# Patient Record
Sex: Female | Born: 1935 | Race: Asian | Hispanic: No | Marital: Single | Smoking: Never smoker
Health system: Southern US, Community
[De-identification: ages and names within clinical notes are randomized; demographics above are authoritative.]

## PROBLEM LIST (undated history)

## (undated) DIAGNOSIS — R011 Cardiac murmur, unspecified: Secondary | ICD-10-CM

## (undated) DIAGNOSIS — F0390 Unspecified dementia without behavioral disturbance: Secondary | ICD-10-CM

## (undated) HISTORY — DX: Cardiac murmur, unspecified: R01.1

---

## 2009-03-04 ENCOUNTER — Emergency Department (HOSPITAL_COMMUNITY): Admission: EM | Admit: 2009-03-04 | Discharge: 2009-03-04 | Payer: Self-pay | Admitting: Emergency Medicine

## 2010-04-17 LAB — CBC
HCT: 34.6 % — ABNORMAL LOW (ref 36.0–46.0)
Hemoglobin: 11.5 g/dL — ABNORMAL LOW (ref 12.0–15.0)
RBC: 4.25 MIL/uL (ref 3.87–5.11)
WBC: 7.5 10*3/uL (ref 4.0–10.5)

## 2010-04-17 LAB — DIFFERENTIAL
Basophils Absolute: 0 10*3/uL (ref 0.0–0.1)
Eosinophils Absolute: 0.3 10*3/uL (ref 0.0–0.7)
Lymphocytes Relative: 30 % (ref 12–46)
Lymphs Abs: 2.2 10*3/uL (ref 0.7–4.0)

## 2010-04-17 LAB — POCT CARDIAC MARKERS: Troponin i, poc: <0.05 ng/mL (ref 0.00–0.09)

## 2010-04-17 LAB — COMPREHENSIVE METABOLIC PANEL
ALT: 15 U/L (ref 0–35)
Albumin: 4 g/dL (ref 3.5–5.2)
Alkaline Phosphatase: 54 U/L (ref 39–117)
CO2: 27 mEq/L (ref 19–32)
Calcium: 9.2 mg/dL (ref 8.4–10.5)
Chloride: 103 mEq/L (ref 96–112)
Creatinine, Ser: 0.9 mg/dL (ref 0.4–1.2)
Total Bilirubin: 1.2 mg/dL (ref 0.3–1.2)
Total Protein: 7.2 g/dL (ref 6.0–8.3)

## 2010-04-17 LAB — URINALYSIS, ROUTINE W REFLEX MICROSCOPIC
Bilirubin Urine: NEGATIVE
Glucose, UA: NEGATIVE mg/dL
Ketones, ur: NEGATIVE mg/dL
pH: 6 (ref 5.0–8.0)

## 2010-04-17 LAB — URINE MICROSCOPIC-ADD ON

## 2013-01-12 ENCOUNTER — Encounter: Payer: Self-pay | Admitting: *Deleted

## 2013-01-12 ENCOUNTER — Institutional Professional Consult (permissible substitution): Payer: Self-pay | Admitting: Cardiology

## 2013-01-20 ENCOUNTER — Encounter: Payer: Self-pay | Admitting: Cardiology

## 2014-02-02 ENCOUNTER — Telehealth: Payer: Self-pay | Admitting: Internal Medicine

## 2014-02-02 NOTE — Telephone Encounter (Signed)
[facility] (Dr Lerry Linerwight Williams) for appointment with Dr Rennis GoldenHilty on 03/02/14.  [facility]. lp

## 2014-03-02 ENCOUNTER — Ambulatory Visit: Payer: Self-pay | Admitting: Internal Medicine

## 2016-01-22 ENCOUNTER — Observation Stay (HOSPITAL_COMMUNITY)
Admission: EM | Admit: 2016-01-22 | Discharge: 2016-01-23 | Disposition: A | Discharge status: Home Health | Payer: Medicare Other | Attending: Internal Medicine | Admitting: Internal Medicine

## 2016-01-22 ENCOUNTER — Encounter (HOSPITAL_COMMUNITY): Payer: Self-pay | Admitting: *Deleted

## 2016-01-22 ENCOUNTER — Emergency Department (HOSPITAL_COMMUNITY): Payer: Medicare Other

## 2016-01-22 DIAGNOSIS — E876 Hypokalemia: Secondary | ICD-10-CM | POA: Diagnosis present

## 2016-01-22 DIAGNOSIS — Y92009 Unspecified place in unspecified non-institutional (private) residence as the place of occurrence of the external cause: Secondary | ICD-10-CM

## 2016-01-22 DIAGNOSIS — W109XXA Fall (on) (from) unspecified stairs and steps, initial encounter: Secondary | ICD-10-CM | POA: Insufficient documentation

## 2016-01-22 DIAGNOSIS — R296 Repeated falls: Secondary | ICD-10-CM

## 2016-01-22 DIAGNOSIS — F0390 Unspecified dementia without behavioral disturbance: Secondary | ICD-10-CM | POA: Diagnosis not present

## 2016-01-22 DIAGNOSIS — L03116 Cellulitis of left lower limb: Secondary | ICD-10-CM

## 2016-01-22 DIAGNOSIS — R531 Weakness: Secondary | ICD-10-CM

## 2016-01-22 DIAGNOSIS — T68XXXA Hypothermia, initial encounter: Secondary | ICD-10-CM | POA: Diagnosis not present

## 2016-01-22 DIAGNOSIS — R001 Bradycardia, unspecified: Secondary | ICD-10-CM | POA: Diagnosis present

## 2016-01-22 DIAGNOSIS — S0083XA Contusion of other part of head, initial encounter: Secondary | ICD-10-CM | POA: Diagnosis not present

## 2016-01-22 DIAGNOSIS — Z66 Do not resuscitate: Secondary | ICD-10-CM | POA: Diagnosis not present

## 2016-01-22 DIAGNOSIS — W19XXXA Unspecified fall, initial encounter: Secondary | ICD-10-CM

## 2016-01-22 DIAGNOSIS — W108XXA Fall (on) (from) other stairs and steps, initial encounter: Secondary | ICD-10-CM | POA: Diagnosis present

## 2016-01-22 DIAGNOSIS — D649 Anemia, unspecified: Secondary | ICD-10-CM | POA: Diagnosis not present

## 2016-01-22 HISTORY — DX: Unspecified dementia, unspecified severity, without behavioral disturbance, psychotic disturbance, mood disturbance, and anxiety: F03.90

## 2016-01-22 LAB — BASIC METABOLIC PANEL
Anion gap: 10 (ref 5–15)
BUN: 46 mg/dL — AB (ref 6–20)
CHLORIDE: 109 mmol/L (ref 101–111)
CO2: 26 mmol/L (ref 22–32)
CREATININE: 1.01 mg/dL — AB (ref 0.44–1.00)
Calcium: 8.6 mg/dL — ABNORMAL LOW (ref 8.9–10.3)
GFR calc Af Amer: 57 mL/min — ABNORMAL LOW (ref >60)
GFR calc non Af Amer: 49 mL/min — ABNORMAL LOW (ref >60)
GLUCOSE: 89 mg/dL (ref 65–99)
Potassium: 3 mmol/L — ABNORMAL LOW (ref 3.5–5.1)
SODIUM: 145 mmol/L (ref 135–145)

## 2016-01-22 LAB — URINALYSIS, ROUTINE W REFLEX MICROSCOPIC
BILIRUBIN URINE: NEGATIVE
GLUCOSE, UA: NEGATIVE mg/dL
HGB URINE DIPSTICK: NEGATIVE
Ketones, ur: 5 mg/dL — AB
Leukocytes, UA: NEGATIVE
Nitrite: NEGATIVE
PH: 5 (ref 5.0–8.0)
Protein, ur: NEGATIVE mg/dL
SPECIFIC GRAVITY, URINE: 1.017 (ref 1.005–1.030)

## 2016-01-22 LAB — CBC
HCT: 31.4 % — ABNORMAL LOW (ref 36.0–46.0)
HEMOGLOBIN: 10.1 g/dL — AB (ref 12.0–15.0)
MCH: 24.9 pg — AB (ref 26.0–34.0)
MCHC: 32.2 g/dL (ref 30.0–36.0)
MCV: 77.3 fL — AB (ref 78.0–100.0)
Platelets: 193 10*3/uL (ref 150–400)
RBC: 4.06 MIL/uL (ref 3.87–5.11)
RDW: 15.1 % (ref 11.5–15.5)
WBC: 4.1 10*3/uL (ref 4.0–10.5)

## 2016-01-22 LAB — I-STAT CG4 LACTIC ACID, ED: LACTIC ACID, VENOUS: 0.73 mmol/L (ref 0.5–1.9)

## 2016-01-22 MED ORDER — CEFAZOLIN IN D5W 1 GM/50ML IV SOLN
1.0000 g | Freq: Once | INTRAVENOUS | Status: AC
  Administered 2016-01-23: 1 g via INTRAVENOUS
  Filled 2016-01-22: qty 50

## 2016-01-22 MED ORDER — POTASSIUM CHLORIDE CRYS ER 20 MEQ PO TBCR
20.0000 meq | EXTENDED_RELEASE_TABLET | Freq: Once | ORAL | Status: DC
Start: 2016-01-22 — End: 2016-01-23
  Filled 2016-01-22: qty 1

## 2016-01-22 MED ORDER — CEPHALEXIN 500 MG PO CAPS: 500.0000 mg | ORAL_CAPSULE | Freq: Once | ORAL | Status: DC

## 2016-01-22 MED ORDER — CEPHALEXIN 500 MG PO CAPS
500.0000 mg | ORAL_CAPSULE | Freq: Three times a day (TID) | ORAL | 0 refills | Status: DC
Start: 2016-01-22 — End: 2016-01-23

## 2016-01-22 NOTE — ED Provider Notes (Addendum)
[facility]-EMERGENCY DEPT Provider Note   CSN: [id number] Arrival date & time: 01/22/16  1829     History   Chief Complaint Chief Complaint  Patient presents with  . Fall    HPI Beth Hampton is a 82 y.o. female.  Patient brought from home by EMS s/p fall.  Unclear from report if mechanical fall vs syncope.  Patient has dementia at baseline, and family indicates pts mental status is c/w her baseline.   Family also indicates in past several days has been scratching at skin wound to left lower leg.  Patient not verbally responsive to questions - level 5 caveat. No reported fevers.  Family indicates at baseline confused, and spends most of day bent over at waist in sitting position, with head resting on legs.  No reported fevers. Family indicates pt appears generally weak.    The history is provided by the patient and a relative. The history is limited by the condition of the patient.  Fall     Past Medical History:  Diagnosis Date  . Murmur     There are no active problems to display for this patient.   History reviewed. No pertinent surgical history.  OB History    No data available       Home Medications    Prior to Admission medications   Not on File    Family History No family history on file.  Social History Social History  Substance Use Topics  . Smoking status: Never Smoker  . Smokeless tobacco: Never Used  . Alcohol use No     Allergies   Patient has no known allergies.   Review of Systems Review of Systems  Unable to perform ROS: Patient unresponsive  Patient not verbally responsive, level 5 caveat.    Physical Exam Updated Vital Signs BP 134/73   Pulse (!) 55   Resp 18   Ht 4\' 11"  (1.499 m)   Wt 40.4 kg   SpO2 100%   BMI 17.98 kg/m   Physical Exam  Constitutional: She appears well-developed and well-nourished. No distress.  HENT:  Mouth/Throat: Oropharynx is clear and moist.  Contusion to forehead/face.   Eyes:  Conjunctivae are normal. Pupils are equal, round, and reactive to light. No scleral icterus.  Neck: Neck supple. No tracheal deviation present.  Cardiovascular: Normal rate, regular rhythm, normal heart sounds and intact distal pulses.   Pulmonary/Chest: Effort normal and breath sounds normal. No respiratory distress.  Abdominal: Normal appearance. She exhibits no distension. There is no tenderness.  Genitourinary:  Genitourinary Comments: No cva tenderness  Musculoskeletal: She exhibits no edema.  CTLS spine, non tender, aligned, no step off. Approximately 8 cm diameter ulcerated area medial aspect left lower leg with mild surrounding erythema, ?mild/early cellulitis. No focal pain or bony tenderness on bil extremity exam.   Neurological:  pts opens eyes to command, and verbalizes to family in a way c/w her baseline/confused. Moves bil extremities purposefully.   Skin: Skin is warm and dry. No rash noted. She is not diaphoretic.  Psychiatric:  Mental state c/w baseline per family.   Nursing note and vitals reviewed.    ED Treatments / Results  Labs (all labs ordered are listed, but only abnormal results are displayed) Results for orders placed or performed during the hospital encounter of 01/22/16  CBC  Result Value Ref Range   WBC 4.1 4.0 - 10.5 K/uL   RBC 4.06 3.87 - 5.11 MIL/uL   Hemoglobin 10.1 (L) 12.0 - 15.0  g/dL   HCT 16.1 (L) 09.6 - 04.5 %   MCV 77.3 (L) 78.0 - 100.0 fL   MCH 24.9 (L) 26.0 - 34.0 pg   MCHC 32.2 30.0 - 36.0 g/dL   RDW 40.9 81.1 - 91.4 %   Platelets 193 150 - 400 K/uL  Basic metabolic panel  Result Value Ref Range   Sodium 145 135 - 145 mmol/L   Potassium 3.0 (L) 3.5 - 5.1 mmol/L   Chloride 109 101 - 111 mmol/L   CO2 26 22 - 32 mmol/L   Glucose, Bld 89 65 - 99 mg/dL   BUN 46 (H) 6 - 20 mg/dL   Creatinine, Ser 7.82 (H) 0.44 - 1.00 mg/dL   Calcium 8.6 (L) 8.9 - 10.3 mg/dL   GFR calc non Af Amer 49 (L) >60 mL/min   GFR calc Af Amer 57 (L) >60 mL/min     Anion gap 10 5 - 15   Ct Head Wo Contrast  Result Date: 01/22/2016 CLINICAL DATA:  Under witnessed fall earlier today. Failure to parotid with decreased mental status. Right periorbital bruising. EXAM: CT HEAD WITHOUT CONTRAST CT CERVICAL SPINE WITHOUT CONTRAST TECHNIQUE: Multidetector CT imaging of the head and cervical spine was performed following the standard protocol without intravenous contrast. Multiplanar CT image reconstructions of the cervical spine were also generated. COMPARISON:  CT head 03/04/2009. FINDINGS: CT HEAD FINDINGS Brain: There is no evidence of acute intracranial hemorrhage, mass lesion, brain edema or extra-axial fluid collection. There is progressive prominence of the ventricles and subarachnoid spaces consistent with atrophy. There are mild chronic small vessel ischemic changes in the periventricular white matter. There is no CT evidence of acute cortical infarction. Vascular: Intracranial vascular calcifications are present. No evidence of hyperdense vessel. Skull: Negative for fracture or focal lesion. Sinuses/Orbits: There is some mucosal thickening in the right maxillary sinus. The visualized paranasal sinuses, mastoid air cells and middle ears are otherwise clear. No air-fluid levels are identified. There is some periorbital soft tissue swelling on the right. The globes are intact. Significant maxillary and mandibular periodontal disease is partially imaged on the reformatted images. Other: None. CT CERVICAL SPINE FINDINGS Alignment: Reversal of lordosis without significant focal angulation or listhesis. Thoracic kyphosis. Skull base and vertebrae: The bones are demineralized. No evidence of acute cervical spine fracture or traumatic subluxation. Degenerative changes are present anteriorly at C1-2. Soft tissues and spinal canal: No prevertebral soft tissue swelling identified. No visible canal hematoma. The soft tissues of the neck appear diffusely edematous. The thyroid  gland is heterogeneously enlarged bilaterally, likely due to goiter. Disc levels: The disc spaces are relatively preserved. No large disc herniation identified. Upper chest: The lung apices are clear. There is atherosclerosis and tortuosity of the aorta and great vessels. As above, the thyroid gland is heterogeneously enlarged. Other: Exam mildly limited by thoracic kyphosis. IMPRESSION: 1. No acute intracranial or calvarial findings identified. 2. No evidence of acute cervical spine fracture, traumatic subluxation or static signs of instability. 3. Periodontal disease. 4. Right periorbital soft tissue swelling. 5. Thyromegaly, likely goiter. Electronically Signed   By: Carey Bullocks M.D.   On: 01/22/2016 20:36   Ct Cervical Spine Wo Contrast  Result Date: 01/22/2016 CLINICAL DATA:  Under witnessed fall earlier today. Failure to parotid with decreased mental status. Right periorbital bruising. EXAM: CT HEAD WITHOUT CONTRAST CT CERVICAL SPINE WITHOUT CONTRAST TECHNIQUE: Multidetector CT imaging of the head and cervical spine was performed following the standard protocol without intravenous contrast. Multiplanar  CT image reconstructions of the cervical spine were also generated. COMPARISON:  CT head 03/04/2009. FINDINGS: CT HEAD FINDINGS Brain: There is no evidence of acute intracranial hemorrhage, mass lesion, brain edema or extra-axial fluid collection. There is progressive prominence of the ventricles and subarachnoid spaces consistent with atrophy. There are mild chronic small vessel ischemic changes in the periventricular white matter. There is no CT evidence of acute cortical infarction. Vascular: Intracranial vascular calcifications are present. No evidence of hyperdense vessel. Skull: Negative for fracture or focal lesion. Sinuses/Orbits: There is some mucosal thickening in the right maxillary sinus. The visualized paranasal sinuses, mastoid air cells and middle ears are otherwise clear. No air-fluid  levels are identified. There is some periorbital soft tissue swelling on the right. The globes are intact. Significant maxillary and mandibular periodontal disease is partially imaged on the reformatted images. Other: None. CT CERVICAL SPINE FINDINGS Alignment: Reversal of lordosis without significant focal angulation or listhesis. Thoracic kyphosis. Skull base and vertebrae: The bones are demineralized. No evidence of acute cervical spine fracture or traumatic subluxation. Degenerative changes are present anteriorly at C1-2. Soft tissues and spinal canal: No prevertebral soft tissue swelling identified. No visible canal hematoma. The soft tissues of the neck appear diffusely edematous. The thyroid gland is heterogeneously enlarged bilaterally, likely due to goiter. Disc levels: The disc spaces are relatively preserved. No large disc herniation identified. Upper chest: The lung apices are clear. There is atherosclerosis and tortuosity of the aorta and great vessels. As above, the thyroid gland is heterogeneously enlarged. Other: Exam mildly limited by thoracic kyphosis. IMPRESSION: 1. No acute intracranial or calvarial findings identified. 2. No evidence of acute cervical spine fracture, traumatic subluxation or static signs of instability. 3. Periodontal disease. 4. Right periorbital soft tissue swelling. 5. Thyromegaly, likely goiter. Electronically Signed   By: Carey BullocksWilliam  Veazey M.D.   On: 01/22/2016 20:36    EKG  EKG Interpretation None       Radiology No results found.  Procedures Procedures (including critical care time)  Medications Ordered in ED Medications - No data to display   Initial Impression / Assessment and Plan / ED Course  I have reviewed the triage vital signs and the nursing notes.  Pertinent labs & imaging results that were available during my care of the patient were reviewed by me and considered in my medical decision making (see chart for details).  Clinical Course     Labs. Imaging studies.  Reviewed nursing notes and prior charts for additional history.   Recheck pt, mental status remains c/w baseline.  Pt in no apparent distress.  Icepack.   On checking temp, 90 rectally. bair Journalist, newspaperhugger.  Ivf.   Added lactate and cultures to workup. ua pending.  Will rx leg wound/?cellulitis w abx.   Given hypothermia, fall ?syncope vs mechanical, leg wound/cellulitis, will admit.   Hospitalist consulted for admission.       Final Clinical Impressions(s) / ED Diagnoses   Final diagnoses:  None    New Prescriptions New Prescriptions   No medications on file          Cathren LaineKevin Augustino Savastano, MD 01/22/16 2343

## 2016-01-22 NOTE — H&P (Addendum)
History and Physical    Beth Hampton YNW:[id number] DOB: Nov 08, 1926 DOA: 01/22/2016  PCP: No primary care provider on file.; [facility] [address]) Consultants:  None Patient coming from: home - with niece, her husband; has nighttime sitters; NOK: niece, Junious DresserConnie, [phone]  Chief Complaint: fall  HPI: Beth Hampton is a 82 y.o. female with medical history significant of dementia and a heart murmur presenting after a fall at home this AM.  Niece was home with her, reports that she tripped on the last step and fell down.  Down maybe 10 minutes, needed help getting up.  Very tired, maybe didn't sleep well last night.  She also scratches herself up very badly.  Unable to care for herself, doesn't listen to her niece - niece is interested in talking to SW about placement.   Denies acute illness other than today's fall.  The patient has advanced dementia and did not provide any of the history.   ED Course: Per Dr. Denton LankSteinl: Labs. Imaging studies.  Reviewed nursing notes and prior charts for additional history.  Recheck pt, mental status remains c/w baseline. Pt in no apparent distress.  Icepack.  On checking temp, 90 rectally. bair Journalist, newspaperhugger. Ivf.  Added lactate and cultures to workup. ua pending.  Will rx leg wound/?cellulitis w abx.  Given hypothermia, fall ?syncope vs mechanical, leg wound/cellulitis, will admit.  Hospitalist consulted for admission.    Review of Systems: As per HPI; otherwise 10 point review of systems reviewed and negative.   Ambulatory Status:  Ambulates independently  Past Medical History:  Diagnosis Date  . Dementia   . Murmur     History reviewed. No pertinent surgical history.  Social History   Social History  . Marital status: Single    Spouse name: N/A  . Number of children: N/A  . Years of education: N/A   Occupational History  . retired    Social History Main Topics  . Smoking status: Never Smoker  .  Smokeless tobacco: Current User    Types: Chew  . Alcohol use No  . Drug use: No  . Sexual activity: Not on file   Other Topics Concern  . Not on file   Social History Narrative  . No narrative on file    No Known Allergies  History reviewed. No pertinent family history.  Prior to Admission medications   Medication Sig Start Date End Date Taking? Authorizing Provider  cephALEXin (KEFLEX) 500 MG capsule Take 1 capsule (500 mg total) by mouth 3 (three) times daily. 01/22/16   Cathren LaineKevin Steinl, MD    Physical Exam: Vitals:   01/23/16 0030 01/23/16 0100 01/23/16 0103 01/23/16 0130  BP: (!) 99/54 99/55 99/55  (!) 95/47  Pulse: (!) 54 (!) 58 60 (!) 56  Resp: 21 13 12 23   Temp:   (!) 95.1 F (35.1 C)   TempSrc:   Rectal   SpO2: 97% 98% 97% 95%  Weight:      Height:         General:  Appears calm and comfortable and is sleeping in NAD under the bair hugger Eyes:  PERRL, EOMI, normal lids, iris ENT:  grossly normal hearing, lips & tongue, mmm Neck:  no LAD, masses or thyromegaly Cardiovascular:  Bradycardia, 3-4/6 systolic murmur, no r/g. No LE edema.  Respiratory:  CTA bilaterally, no w/r/r. Normal respiratory effort. Abdomen:  soft, ntnd, NABS Skin: 4 cm skin tear on the left anterior lower leg with small  amount of purulence; both legs are erythematous under the Bair hugger, but not obviously more affected than the remainder of that leg or the other leg.  Ecchymosis surrounding right eye > left Musculoskeletal:  grossly normal tone BUE/BLE, good ROM, no bony abnormality Psychiatric: somnolent, not interactive Neurologic: unable to perform  Labs on Admission: I have personally reviewed following labs and imaging studies  CBC:  Recent Labs Lab 01/22/16 2104  WBC 4.1  HGB 10.1*  HCT 31.4*  MCV 77.3*  PLT 193   Basic Metabolic Panel:  Recent Labs Lab 01/22/16 2104  NA 145  K 3.0*  CL 109  CO2 26  GLUCOSE 89  BUN 46*  CREATININE 1.01*  CALCIUM 8.6*    GFR: Estimated Creatinine Clearance: 25.5 mL/min (by C-G formula based on SCr of 1.01 mg/dL (H)). Liver Function Tests: No results for input(s): AST, ALT, ALKPHOS, BILITOT, PROT, ALBUMIN in the last 168 hours. No results for input(s): LIPASE, AMYLASE in the last 168 hours. No results for input(s): AMMONIA in the last 168 hours. Coagulation Profile: No results for input(s): INR, PROTIME in the last 168 hours. Cardiac Enzymes: No results for input(s): CKTOTAL, CKMB, CKMBINDEX, TROPONINI in the last 168 hours. BNP (last 3 results) No results for input(s): PROBNP in the last 8760 hours. HbA1C: No results for input(s): HGBA1C in the last 72 hours. CBG: No results for input(s): GLUCAP in the last 168 hours. Lipid Profile: No results for input(s): CHOL, HDL, LDLCALC, TRIG, CHOLHDL, LDLDIRECT in the last 72 hours. Thyroid Function Tests: No results for input(s): TSH, T4TOTAL, FREET4, T3FREE, THYROIDAB in the last 72 hours. Anemia Panel: No results for input(s): VITAMINB12, FOLATE, FERRITIN, TIBC, IRON, RETICCTPCT in the last 72 hours. Urine analysis:    Component Value Date/Time   COLORURINE YELLOW 01/22/2016 2231   APPEARANCEUR HAZY (A) 01/22/2016 2231   LABSPEC 1.017 01/22/2016 2231   PHURINE 5.0 01/22/2016 2231   GLUCOSEU NEGATIVE 01/22/2016 2231   HGBUR NEGATIVE 01/22/2016 2231   BILIRUBINUR NEGATIVE 01/22/2016 2231   KETONESUR 5 (A) 01/22/2016 2231   PROTEINUR NEGATIVE 01/22/2016 2231   UROBILINOGEN 1.0 03/04/2009 1536   NITRITE NEGATIVE 01/22/2016 2231   LEUKOCYTESUR NEGATIVE 01/22/2016 2231    Creatinine Clearance: Estimated Creatinine Clearance: 25.5 mL/min (by C-G formula based on SCr of 1.01 mg/dL (H)).  Sepsis Labs: @LABRCNTIP (procalcitonin:4,lacticidven:4) )No results found for this or any previous visit (from the past 240 hour(s)).   Radiological Exams on Admission: Ct Head Wo Contrast  Result Date: 01/22/2016 CLINICAL DATA:  Under witnessed fall earlier  today. Failure to parotid with decreased mental status. Right periorbital bruising. EXAM: CT HEAD WITHOUT CONTRAST CT CERVICAL SPINE WITHOUT CONTRAST TECHNIQUE: Multidetector CT imaging of the head and cervical spine was performed following the standard protocol without intravenous contrast. Multiplanar CT image reconstructions of the cervical spine were also generated. COMPARISON:  CT head 03/04/2009. FINDINGS: CT HEAD FINDINGS Brain: There is no evidence of acute intracranial hemorrhage, mass lesion, brain edema or extra-axial fluid collection. There is progressive prominence of the ventricles and subarachnoid spaces consistent with atrophy. There are mild chronic small vessel ischemic changes in the periventricular white matter. There is no CT evidence of acute cortical infarction. Vascular: Intracranial vascular calcifications are present. No evidence of hyperdense vessel. Skull: Negative for fracture or focal lesion. Sinuses/Orbits: There is some mucosal thickening in the right maxillary sinus. The visualized paranasal sinuses, mastoid air cells and middle ears are otherwise clear. No air-fluid levels are identified. There is some  periorbital soft tissue swelling on the right. The globes are intact. Significant maxillary and mandibular periodontal disease is partially imaged on the reformatted images. Other: None. CT CERVICAL SPINE FINDINGS Alignment: Reversal of lordosis without significant focal angulation or listhesis. Thoracic kyphosis. Skull base and vertebrae: The bones are demineralized. No evidence of acute cervical spine fracture or traumatic subluxation. Degenerative changes are present anteriorly at C1-2. Soft tissues and spinal canal: No prevertebral soft tissue swelling identified. No visible canal hematoma. The soft tissues of the neck appear diffusely edematous. The thyroid gland is heterogeneously enlarged bilaterally, likely due to goiter. Disc levels: The disc spaces are relatively preserved.  No large disc herniation identified. Upper chest: The lung apices are clear. There is atherosclerosis and tortuosity of the aorta and great vessels. As above, the thyroid gland is heterogeneously enlarged. Other: Exam mildly limited by thoracic kyphosis. IMPRESSION: 1. No acute intracranial or calvarial findings identified. 2. No evidence of acute cervical spine fracture, traumatic subluxation or static signs of instability. 3. Periodontal disease. 4. Right periorbital soft tissue swelling. 5. Thyromegaly, likely goiter. Electronically Signed   By: Carey Bullocks M.D.   On: 01/22/2016 20:36   Ct Cervical Spine Wo Contrast  Result Date: 01/22/2016 CLINICAL DATA:  Under witnessed fall earlier today. Failure to parotid with decreased mental status. Right periorbital bruising. EXAM: CT HEAD WITHOUT CONTRAST CT CERVICAL SPINE WITHOUT CONTRAST TECHNIQUE: Multidetector CT imaging of the head and cervical spine was performed following the standard protocol without intravenous contrast. Multiplanar CT image reconstructions of the cervical spine were also generated. COMPARISON:  CT head 03/04/2009. FINDINGS: CT HEAD FINDINGS Brain: There is no evidence of acute intracranial hemorrhage, mass lesion, brain edema or extra-axial fluid collection. There is progressive prominence of the ventricles and subarachnoid spaces consistent with atrophy. There are mild chronic small vessel ischemic changes in the periventricular white matter. There is no CT evidence of acute cortical infarction. Vascular: Intracranial vascular calcifications are present. No evidence of hyperdense vessel. Skull: Negative for fracture or focal lesion. Sinuses/Orbits: There is some mucosal thickening in the right maxillary sinus. The visualized paranasal sinuses, mastoid air cells and middle ears are otherwise clear. No air-fluid levels are identified. There is some periorbital soft tissue swelling on the right. The globes are intact. Significant  maxillary and mandibular periodontal disease is partially imaged on the reformatted images. Other: None. CT CERVICAL SPINE FINDINGS Alignment: Reversal of lordosis without significant focal angulation or listhesis. Thoracic kyphosis. Skull base and vertebrae: The bones are demineralized. No evidence of acute cervical spine fracture or traumatic subluxation. Degenerative changes are present anteriorly at C1-2. Soft tissues and spinal canal: No prevertebral soft tissue swelling identified. No visible canal hematoma. The soft tissues of the neck appear diffusely edematous. The thyroid gland is heterogeneously enlarged bilaterally, likely due to goiter. Disc levels: The disc spaces are relatively preserved. No large disc herniation identified. Upper chest: The lung apices are clear. There is atherosclerosis and tortuosity of the aorta and great vessels. As above, the thyroid gland is heterogeneously enlarged. Other: Exam mildly limited by thoracic kyphosis. IMPRESSION: 1. No acute intracranial or calvarial findings identified. 2. No evidence of acute cervical spine fracture, traumatic subluxation or static signs of instability. 3. Periodontal disease. 4. Right periorbital soft tissue swelling. 5. Thyromegaly, likely goiter. Electronically Signed   By: Carey Bullocks M.D.   On: 01/22/2016 20:36   Dg Chest Port 1 View  Result Date: 01/23/2016 CLINICAL DATA:  Status post fall. Generalized weakness and  decreased temperature. Initial encounter. EXAM: PORTABLE CHEST 1 VIEW COMPARISON:  Chest radiograph performed 03/04/2009 FINDINGS: The lungs are well-aerated. There is elevation of the right hemidiaphragm. There is no evidence of focal opacification, pleural effusion or pneumothorax. The right lung apex is obscured by the patient's head. The cardiomediastinal silhouette is borderline enlarged. Calcification is seen at the aortic arch. No acute osseous abnormalities are seen. IMPRESSION: Borderline cardiomegaly.  Elevation of the right hemidiaphragm. Lungs remain grossly clear. No displaced rib fracture seen. Electronically Signed   By: Roanna Raider M.D.   On: 01/23/2016 00:16    EKG: Independently reviewed.  Sinus bradycardia with rate 48; incomplete RBBB with no evidence of acute ischemia  Assessment/Plan Principal Problem:   Hypothermia Active Problems:   Fall (on) (from) other stairs and steps, initial encounter   Bradycardia   Anemia   Dementia   Hypokalemia   Hypothermia -Patient with hypothermia from unclear etiology -Does not have other SIRS criteria for sepsis, but patient received antibiotics in the ER and blood cultures are pending -Will not continue antibiotics at this time, as the patient has no apparent s/sx of infection, normal WBC count, and no other infectious sources realized on studies to date -May be related to bradycardia, environment, or other -Will observe overnight with Bair hugger, rectal probe until temp is consistently >95  Bradycardia -May be new onset of sick sinus syndrome -Patient does not regularly see a physician and niece reports that patient prefers a natural death -If concern for persistent bradycardia once she is no longer hypothermic, consider EP evaluation - but would definitely discuss with niece first because she may not wish to pursue this  Fall -Multiple abrasions/excoriations - which may also be from chronic scratching -Appears to have been a mechanical fall based on niece's description -No current concern for significant injury at this time -Leg wound may have superifcial infection - given Ancef x 1 in ER, will not continue for now, but will continue wound care order for bacitracin and sterile dressing  Dementia -Niece would like to discuss placement with SW  Hypokalemia -K 3.0 -Given 20 mEq KCl PO in ER, will give an additional 30 mEq for total of 50 mEq  Anemia -Hgb 10.1, which is lower than 11.5 in 2011 -Will follow  DVT  prophylaxis: Lovenox  Code Status: DNR - confirmed with family Family Communication: Niece present throughout evaluation Disposition Plan:  Home once clinically improved Consults called: SW  Admission status: It is my clinical opinion that referral for OBSERVATION is reasonable and necessary in this patient based on the above information provided. The aforementioned taken together are felt to place the patient at high risk for further clinical deterioration. However it is anticipated that the patient may be medically stable for discharge from the hospital within 24 to 48 hours.    Jonah Blue MD [facility]  If 7PM-7AM, please contact night-coverage [email or web address] Password [email or web address]  01/23/2016, 1:42 AM

## 2016-01-22 NOTE — Discharge Instructions (Signed)
It was our pleasure to provide your ER care today - we hope that you feel better.  Your ct scans were read as showing no acute bony injury or fracture.   You may give tylenol as need for pain.   Ice/coldpack to sore area.   Fall precautions - assist patient when ambulating.   For left leg ulcer, keep wound very clean - wash with soap and water 2x/day. Take antibiotic (keflex) as prescribed.  From today's labs, your potassium level is mildly low (3) - eat plenty of fruits and vegetables, and follow up with primary care doctor in 1 week.  Follow up closely with primary care doctor.   Return to ER if worse, new symptoms, fevers, change in mental status, severe pain, spreading redness from around leg wound, other concern.

## 2016-01-22 NOTE — ED Notes (Signed)
Gave report to Eastman KodakCelestial, Charity fundraiserN.

## 2016-01-22 NOTE — ED Notes (Addendum)
Attempted lab draw for blood culture but unsuccessful.RN,Celestial made aware.

## 2016-01-22 NOTE — ED Notes (Signed)
Awaiting a room to complete catheter and rectal temperature since patient is in a hallway bed.

## 2016-01-22 NOTE — ED Notes (Signed)
Pt transported to a room for further procedures to be performed.

## 2016-01-22 NOTE — ED Triage Notes (Signed)
Per EMS, pt from home, reports fall earlier this am, unwitnessed.  Pt lives with her daughter.  Granddaughter reports pt has been declining mentally and has failure to thrive.  Pt is non-speaking, pt's granddaughter reports pt is verbal.  R periorbital bruising noted.  Denies blood thinner use.  Pt has hx scoliosis-is hunched over.  Pt reports pain in her feet, swelling noted in bila LE per EMS.

## 2016-01-23 ENCOUNTER — Encounter (HOSPITAL_COMMUNITY): Payer: Self-pay | Admitting: Internal Medicine

## 2016-01-23 DIAGNOSIS — W108XXA Fall (on) (from) other stairs and steps, initial encounter: Secondary | ICD-10-CM | POA: Diagnosis present

## 2016-01-23 DIAGNOSIS — F0390 Unspecified dementia without behavioral disturbance: Secondary | ICD-10-CM | POA: Diagnosis not present

## 2016-01-23 DIAGNOSIS — T68XXXA Hypothermia, initial encounter: Secondary | ICD-10-CM | POA: Diagnosis present

## 2016-01-23 DIAGNOSIS — D649 Anemia, unspecified: Secondary | ICD-10-CM | POA: Diagnosis present

## 2016-01-23 DIAGNOSIS — E876 Hypokalemia: Secondary | ICD-10-CM | POA: Diagnosis present

## 2016-01-23 DIAGNOSIS — L03116 Cellulitis of left lower limb: Secondary | ICD-10-CM | POA: Diagnosis not present

## 2016-01-23 DIAGNOSIS — R001 Bradycardia, unspecified: Secondary | ICD-10-CM | POA: Diagnosis present

## 2016-01-23 DIAGNOSIS — R296 Repeated falls: Secondary | ICD-10-CM

## 2016-01-23 DIAGNOSIS — S0083XA Contusion of other part of head, initial encounter: Secondary | ICD-10-CM

## 2016-01-23 DIAGNOSIS — R531 Weakness: Secondary | ICD-10-CM | POA: Insufficient documentation

## 2016-01-23 LAB — CBC
HCT: 29.6 % — ABNORMAL LOW (ref 36.0–46.0)
HEMOGLOBIN: 9.7 g/dL — AB (ref 12.0–15.0)
MCH: 25.4 pg — AB (ref 26.0–34.0)
MCHC: 32.8 g/dL (ref 30.0–36.0)
MCV: 77.5 fL — ABNORMAL LOW (ref 78.0–100.0)
Platelets: 216 10*3/uL (ref 150–400)
RBC: 3.82 MIL/uL — AB (ref 3.87–5.11)
RDW: 15.1 % (ref 11.5–15.5)
WBC: 4.8 10*3/uL (ref 4.0–10.5)

## 2016-01-23 LAB — BASIC METABOLIC PANEL
Anion gap: 12 (ref 5–15)
BUN: 43 mg/dL — ABNORMAL HIGH (ref 6–20)
CALCIUM: 8.2 mg/dL — AB (ref 8.9–10.3)
CHLORIDE: 108 mmol/L (ref 101–111)
CO2: 25 mmol/L (ref 22–32)
Creatinine, Ser: 1.04 mg/dL — ABNORMAL HIGH (ref 0.44–1.00)
GFR calc non Af Amer: 47 mL/min — ABNORMAL LOW (ref >60)
GFR, EST AFRICAN AMERICAN: 55 mL/min — AB (ref >60)
Glucose, Bld: 58 mg/dL — ABNORMAL LOW (ref 65–99)
POTASSIUM: 3.6 mmol/L (ref 3.5–5.1)
Sodium: 145 mmol/L (ref 135–145)

## 2016-01-23 MED ORDER — ONDANSETRON HCL 4 MG/2ML IJ SOLN: 4.0000 mg | Freq: Four times a day (QID) | INTRAMUSCULAR | Status: DC | PRN

## 2016-01-23 MED ORDER — ACETAMINOPHEN 325 MG PO TABS: 650.0000 mg | ORAL_TABLET | Freq: Four times a day (QID) | ORAL | 0 refills | Status: DC | PRN

## 2016-01-23 MED ORDER — HYDROCERIN EX CREA: 1.0000 "application " | TOPICAL_CREAM | Freq: Two times a day (BID) | CUTANEOUS | 0 refills | Status: AC

## 2016-01-23 MED ORDER — ENOXAPARIN SODIUM 30 MG/0.3ML ~~LOC~~ SOLN
20.0000 mg | SUBCUTANEOUS | Status: DC
  Administered 2016-01-23: 20 mg via SUBCUTANEOUS
  Filled 2016-01-23: qty 0.3

## 2016-01-23 MED ORDER — HYDROCERIN EX CREA
TOPICAL_CREAM | Freq: Two times a day (BID) | CUTANEOUS | Status: DC
  Administered 2016-01-23: 1 via TOPICAL
  Filled 2016-01-23: qty 113

## 2016-01-23 MED ORDER — ACETAMINOPHEN 650 MG RE SUPP: 650.0000 mg | Freq: Four times a day (QID) | RECTAL | Status: DC | PRN

## 2016-01-23 MED ORDER — ENOXAPARIN SODIUM 30 MG/0.3ML ~~LOC~~ SOLN: 30.0000 mg | SUBCUTANEOUS | Status: DC

## 2016-01-23 MED ORDER — ACETAMINOPHEN 325 MG PO TABS: 650.0000 mg | ORAL_TABLET | Freq: Four times a day (QID) | ORAL | Status: DC | PRN

## 2016-01-23 MED ORDER — ONDANSETRON HCL 4 MG PO TABS: 4.0000 mg | ORAL_TABLET | Freq: Four times a day (QID) | ORAL | Status: DC | PRN

## 2016-01-23 MED ORDER — INFLUENZA VAC SPLIT QUAD 0.5 ML IM SUSY: 0.5000 mL | PREFILLED_SYRINGE | INTRAMUSCULAR | Status: DC

## 2016-01-23 MED ORDER — LACTATED RINGERS IV SOLN
INTRAVENOUS | Status: DC
  Administered 2016-01-23: 04:00:00 via INTRAVENOUS

## 2016-01-23 MED ORDER — DOXYCYCLINE HYCLATE 100 MG PO TABS: 100.0000 mg | ORAL_TABLET | Freq: Two times a day (BID) | ORAL | 0 refills | Status: DC

## 2016-01-23 MED ORDER — DOXYCYCLINE HYCLATE 100 MG PO TABS
100.0000 mg | ORAL_TABLET | Freq: Two times a day (BID) | ORAL | Status: DC
  Administered 2016-01-23: 100 mg via ORAL
  Filled 2016-01-23: qty 1

## 2016-01-23 MED ORDER — ENSURE ENLIVE PO LIQD: 237.0000 mL | Freq: Two times a day (BID) | ORAL | 1 refills | Status: DC

## 2016-01-23 MED ORDER — SODIUM CHLORIDE 0.9 % IV SOLN: 30.0000 meq | Freq: Once | INTRAVENOUS | Status: DC

## 2016-01-23 MED ORDER — SODIUM CHLORIDE 0.9 % IV SOLN
INTRAVENOUS | Status: AC
  Administered 2016-01-23: 02:00:00 via INTRAVENOUS

## 2016-01-23 MED ORDER — ENSURE ENLIVE PO LIQD
237.0000 mL | Freq: Two times a day (BID) | ORAL | Status: DC
  Administered 2016-01-23: 237 mL via ORAL

## 2016-01-23 MED ORDER — PNEUMOCOCCAL VAC POLYVALENT 25 MCG/0.5ML IJ INJ: 0.5000 mL | INJECTION | INTRAMUSCULAR | Status: DC

## 2016-01-23 MED ORDER — SODIUM CHLORIDE 0.9% FLUSH
3.0000 mL | Freq: Two times a day (BID) | INTRAVENOUS | Status: DC
  Administered 2016-01-23: 3 mL via INTRAVENOUS

## 2016-01-23 MED ORDER — POTASSIUM CHLORIDE 10 MEQ/100ML IV SOLN
10.0000 meq | INTRAVENOUS | Status: AC
  Administered 2016-01-23 (×3): 10 meq via INTRAVENOUS
  Filled 2016-01-23 (×3): qty 100

## 2016-01-23 NOTE — Care Management Note (Addendum)
Case Management Note  Patient Details  Name: Beth Hampton MRN: [id number] Date of Birth: 04-05-1926  Subjective/Objective:                  fall  Action/Plan: Discharge planning Expected Discharge Date:  01/23/16               Expected Discharge Plan:  [place]  In-House Referral:     Discharge planning Services  CM Consult  Post Acute Care Choice:  Home Health, Durable Medical Equipment Choice offered to:  Adult Children  DME Arranged:  [facility] youth DME Agency:  [place] Arranged:  RN, Nurse's Aide, Social Work [employer] Agency:  [place]  Status of Service:  Completed, signed off  If discussed at [employer], dates discussed:    Additional Comments: CM met with pt in room along with her great niece, Marlowe Kays for choice of home health agency. Family choose [facility] to render HHRN/Aide/SW ([place] REQUESTED) and family request agency to schedule with Marlowe Kays or [place] at [phone] as pt has dementia.  Referral with correct contacts and number called to [facility] rep, Jermaine.  Cm gave family Private Duty Agency List and Marlowe Kays verbalized understanding these agencies cost approx 25-30/hour and this is an out of pocket expense as insurance does not cover.  CM notified [facility] DME rep, shannon to please deliver the youth rolling walker to room so pt can discharge.  No other Cm needs were communicated. Dellie Catholic, RN 01/23/2016, 2:56 PM

## 2016-01-23 NOTE — Progress Notes (Signed)
Rx Brief note: Lovenox Rx adjusted Lovenox to 30 mg daily in pt with wt<45 kg and CrCl<30 ml/min  Thanks Lorenza EvangelistGreen, Michel Hendon R 01/23/2016 3:03 AM

## 2016-01-23 NOTE — Care Management Obs Status (Signed)
MEDICARE OBSERVATION STATUS NOTIFICATION   Patient Details  Name: Beth Hampton MRN: [id number] Date of Birth: 12/23/1926   Medicare Observation Status Notification Given:       Yves DillJeffries, Kori Colin Christine, RN 01/23/2016, 2:52 PM

## 2016-01-23 NOTE — Discharge Summary (Signed)
Physician Discharge Summary  Tilford PillarKhemthong Kimball ZOX:[id number] DOB: 09-15-1926 DOA: 01/22/2016  PCP: No primary care provider on file.  Admit date: 01/22/2016 Discharge date: 01/23/2016  Time spent: 30 minutes  Recommendations for Outpatient Follow-up:  1. Repeat BMET to follow electrolytes   Discharge Diagnoses:  Principal Problem:   Hypothermia Active Problems:   Fall (on) (from) other stairs and steps, initial encounter   Bradycardia   Anemia   Dementia   Hypokalemia   Cellulitis of left lower leg   Contusion of face   Fall in elderly patient   Discharge Condition: stable and improved. [facility] Advise to follow up with PCP in 10 days.  Diet recommendation: regular diet   Filed Weights   01/22/16 1848 01/23/16 0218  Weight: 40.4 kg (89 lb) 39.1 kg (86 lb 3.2 oz)    History of present illness:  82 y.o. female with medical history significant of dementia and a heart murmur presenting after a fall at home this AM.  Niece was home with her, reports that she tripped on the last step and fell down.  Down maybe 10 minutes, needed help getting up.  Very tired, maybe didn't sleep well last night.  She also scratches herself up very badly. Found to have skin cellulitis and hypokalemia. Also with hypothermia during evaluation in ED. Admitted for observation.  Hospital Course:  1-skin cellulitis -will treat with doxycycline BID for 10 days -advise/instructed about proper skin care and how to avoid dry skin and future open wounds. -patient's family instructed to keep patient well hydrated   2-mechanical fall -HH services set up to assist with patient care -no abnormalities seen on telemetry  -per family patient experienced mechanical fall  3-dementia -continue supportive care and assistance -very advance dementia, no benefit of aricept or namenda at this stage -HH services arranged  4-hypokalemia -repleted -recommend BMET at follow up to assess  electrolytes  5-chronic anemia -stable and no signs of acute bleeding   6-hypothermia: -most likely due to environmental exposure -non toxic on exam  -temperature WNL at discharge   Procedures:  None   Consultations:  None   Discharge Exam: Vitals:   01/23/16 0558 01/23/16 1422  BP: (!) 143/52 (!) 100/48  Pulse: 72 81  Resp: 18 18  Temp: 97.6 F (36.4 C) 98.6 F (37 C)    General: multiple bruises appreciated; patient oriented X 1 only and with some non-understandable speech at time (per family at bedside, patient is at her baseline).  Cardiovascular: S1 and S2, no rubs, no gallops Respiratory: CTA bilaterally Abdomen:soft, NT, ND, positive BS Extremities: LLE with laceration/abrasion, mild serosanguineous drainage and positive erythema Skin: dry skin appreciated and mild erythema from scratching on her upper extremities   Discharge Instructions   Discharge Instructions    Discharge instructions    Complete by:  As directed    Maintain good hydration  Take medications as prescribed  Follow up with PCP in 10 days     Current Discharge Medication List    START taking these medications   Details  acetaminophen (TYLENOL) 325 MG tablet Take 2 tablets (650 mg total) by mouth every 6 (six) hours as needed for mild pain (or Fever >/= 101). Qty: 40 tablet, Refills: 0    doxycycline (VIBRA-TABS) 100 MG tablet Take 1 tablet (100 mg total) by mouth every 12 (twelve) hours. Qty: 20 tablet, Refills: 0    feeding supplement, ENSURE ENLIVE, (ENSURE ENLIVE) LIQD Take 237 mLs by mouth  2 (two) times daily between meals. Qty: 14220 mL, Refills: 1    hydrocerin (EUCERIN) CREA Apply 1 application topically 2 (two) times daily. Apply to skin to maintain good moisturize level Qty: 228 g, Refills: 0       No Known Allergies   The results of significant diagnostics from this hospitalization (including imaging, microbiology, ancillary and laboratory) are listed below for  reference.    Significant Diagnostic Studies: Ct Head Wo Contrast  Result Date: 01/22/2016 CLINICAL DATA:  Under witnessed fall earlier today. Failure to parotid with decreased mental status. Right periorbital bruising. EXAM: CT HEAD WITHOUT CONTRAST CT CERVICAL SPINE WITHOUT CONTRAST TECHNIQUE: Multidetector CT imaging of the head and cervical spine was performed following the standard protocol without intravenous contrast. Multiplanar CT image reconstructions of the cervical spine were also generated. COMPARISON:  CT head 03/04/2009. FINDINGS: CT HEAD FINDINGS Brain: There is no evidence of acute intracranial hemorrhage, mass lesion, brain edema or extra-axial fluid collection. There is progressive prominence of the ventricles and subarachnoid spaces consistent with atrophy. There are mild chronic small vessel ischemic changes in the periventricular white matter. There is no CT evidence of acute cortical infarction. Vascular: Intracranial vascular calcifications are present. No evidence of hyperdense vessel. Skull: Negative for fracture or focal lesion. Sinuses/Orbits: There is some mucosal thickening in the right maxillary sinus. The visualized paranasal sinuses, mastoid air cells and middle ears are otherwise clear. No air-fluid levels are identified. There is some periorbital soft tissue swelling on the right. The globes are intact. Significant maxillary and mandibular periodontal disease is partially imaged on the reformatted images. Other: None. CT CERVICAL SPINE FINDINGS Alignment: Reversal of lordosis without significant focal angulation or listhesis. Thoracic kyphosis. Skull base and vertebrae: The bones are demineralized. No evidence of acute cervical spine fracture or traumatic subluxation. Degenerative changes are present anteriorly at C1-2. Soft tissues and spinal canal: No prevertebral soft tissue swelling identified. No visible canal hematoma. The soft tissues of the neck appear diffusely  edematous. The thyroid gland is heterogeneously enlarged bilaterally, likely due to goiter. Disc levels: The disc spaces are relatively preserved. No large disc herniation identified. Upper chest: The lung apices are clear. There is atherosclerosis and tortuosity of the aorta and great vessels. As above, the thyroid gland is heterogeneously enlarged. Other: Exam mildly limited by thoracic kyphosis. IMPRESSION: 1. No acute intracranial or calvarial findings identified. 2. No evidence of acute cervical spine fracture, traumatic subluxation or static signs of instability. 3. Periodontal disease. 4. Right periorbital soft tissue swelling. 5. Thyromegaly, likely goiter. Electronically Signed   By: Carey Bullocks M.D.   On: 01/22/2016 20:36   Ct Cervical Spine Wo Contrast  Result Date: 01/22/2016 CLINICAL DATA:  Under witnessed fall earlier today. Failure to parotid with decreased mental status. Right periorbital bruising. EXAM: CT HEAD WITHOUT CONTRAST CT CERVICAL SPINE WITHOUT CONTRAST TECHNIQUE: Multidetector CT imaging of the head and cervical spine was performed following the standard protocol without intravenous contrast. Multiplanar CT image reconstructions of the cervical spine were also generated. COMPARISON:  CT head 03/04/2009. FINDINGS: CT HEAD FINDINGS Brain: There is no evidence of acute intracranial hemorrhage, mass lesion, brain edema or extra-axial fluid collection. There is progressive prominence of the ventricles and subarachnoid spaces consistent with atrophy. There are mild chronic small vessel ischemic changes in the periventricular white matter. There is no CT evidence of acute cortical infarction. Vascular: Intracranial vascular calcifications are present. No evidence of hyperdense vessel. Skull: Negative for fracture or focal lesion.  Sinuses/Orbits: There is some mucosal thickening in the right maxillary sinus. The visualized paranasal sinuses, mastoid air cells and middle ears are otherwise  clear. No air-fluid levels are identified. There is some periorbital soft tissue swelling on the right. The globes are intact. Significant maxillary and mandibular periodontal disease is partially imaged on the reformatted images. Other: None. CT CERVICAL SPINE FINDINGS Alignment: Reversal of lordosis without significant focal angulation or listhesis. Thoracic kyphosis. Skull base and vertebrae: The bones are demineralized. No evidence of acute cervical spine fracture or traumatic subluxation. Degenerative changes are present anteriorly at C1-2. Soft tissues and spinal canal: No prevertebral soft tissue swelling identified. No visible canal hematoma. The soft tissues of the neck appear diffusely edematous. The thyroid gland is heterogeneously enlarged bilaterally, likely due to goiter. Disc levels: The disc spaces are relatively preserved. No large disc herniation identified. Upper chest: The lung apices are clear. There is atherosclerosis and tortuosity of the aorta and great vessels. As above, the thyroid gland is heterogeneously enlarged. Other: Exam mildly limited by thoracic kyphosis. IMPRESSION: 1. No acute intracranial or calvarial findings identified. 2. No evidence of acute cervical spine fracture, traumatic subluxation or static signs of instability. 3. Periodontal disease. 4. Right periorbital soft tissue swelling. 5. Thyromegaly, likely goiter. Electronically Signed   By: Carey BullocksWilliam  Veazey M.D.   On: 01/22/2016 20:36   Dg Chest Port 1 View  Result Date: 01/23/2016 CLINICAL DATA:  Status post fall. Generalized weakness and decreased temperature. Initial encounter. EXAM: PORTABLE CHEST 1 VIEW COMPARISON:  Chest radiograph performed 03/04/2009 FINDINGS: The lungs are well-aerated. There is elevation of the right hemidiaphragm. There is no evidence of focal opacification, pleural effusion or pneumothorax. The right lung apex is obscured by the patient's head. The cardiomediastinal silhouette is borderline  enlarged. Calcification is seen at the aortic arch. No acute osseous abnormalities are seen. IMPRESSION: Borderline cardiomegaly. Elevation of the right hemidiaphragm. Lungs remain grossly clear. No displaced rib fracture seen. Electronically Signed   By: Roanna RaiderJeffery  Chang M.D.   On: 01/23/2016 00:16   Labs: Basic Metabolic Panel:  Recent Labs Lab 01/22/16 2104 01/23/16 0630  NA 145 145  K 3.0* 3.6  CL 109 108  CO2 26 25  GLUCOSE 89 58*  BUN 46* 43*  CREATININE 1.01* 1.04*  CALCIUM 8.6* 8.2*   CBC:  Recent Labs Lab 01/22/16 2104 01/23/16 0630  WBC 4.1 4.8  HGB 10.1* 9.7*  HCT 31.4* 29.6*  MCV 77.3* 77.5*  PLT 193 216    Signed:  Vassie LollMadera, Dantae Meunier MD.  [facility] 01/23/2016, 3:14 PM

## 2016-01-28 LAB — CULTURE, BLOOD (ROUTINE X 2)
CULTURE: NO GROWTH
Culture: NO GROWTH

## 2016-03-13 ENCOUNTER — Emergency Department (HOSPITAL_COMMUNITY)
Admission: EM | Admit: 2016-03-13 | Discharge: 2016-03-13 | Disposition: A | Discharge status: Home / Self Care | Payer: Medicare Other | Attending: Emergency Medicine | Admitting: Emergency Medicine

## 2016-03-13 ENCOUNTER — Encounter (HOSPITAL_COMMUNITY): Payer: Self-pay | Admitting: Emergency Medicine

## 2016-03-13 DIAGNOSIS — K623 Rectal prolapse: Secondary | ICD-10-CM | POA: Diagnosis present

## 2016-03-13 DIAGNOSIS — Z79899 Other long term (current) drug therapy: Secondary | ICD-10-CM | POA: Insufficient documentation

## 2016-03-13 NOTE — Consult Note (Signed)
[facility] Consult Note  Beth Hampton 07-19-1926  [id number].    Requesting MD: Rubin Payor Chief Complaint/Reason for Consult: Rectal prolapse HPI:  Beth Hampton is an 83yo female PMH dementia brought to ED by EMS complaining of rectal pain. No family currently at bedside, therefore her history was taken from the chart. Apparently she began having rectal pain after a bowel movement about 1 week ago, and subsequently has had multiple watery stools. Family thought that she just had hemorrhoids, but when EMS arrived she was found to have a rectal prolapse. Unsure if this has happened before. Her pain is constant and unchanging.  PMH significant for dementia Abdominal surgical history: none known Anticoagulants: none  ROS: Review of Systems  Constitutional: Negative.   HENT: Negative.   Eyes: Negative.   Cardiovascular: Negative.   Gastrointestinal:       Rectal pain  Genitourinary: Negative.   Skin: Negative.   All systems reviewed and otherwise negative except for as above  No family history on file.  Past Medical History:  Diagnosis Date  . Dementia   . Murmur     History reviewed. No pertinent surgical history.  Social History:  reports that she has never smoked. Her smokeless tobacco use includes Chew. She reports that she does not drink alcohol or use drugs.  Allergies: No Known Allergies   (Not in a hospital admission)  Prior to Admission medications   Medication Sig Start Date End Date Taking? Authorizing Provider  acetaminophen (TYLENOL) 325 MG tablet Take 2 tablets (650 mg total) by mouth every 6 (six) hours as needed for mild pain (or Fever >/= 101). 01/23/16   Vassie Loll, MD  doxycycline (VIBRA-TABS) 100 MG tablet Take 1 tablet (100 mg total) by mouth every 12 (twelve) hours. 01/23/16   Vassie Loll, MD  feeding supplement, ENSURE ENLIVE, (ENSURE ENLIVE) LIQD Take 237 mLs by mouth 2 (two) times daily between meals. 01/23/16    Vassie Loll, MD  hydrocerin (EUCERIN) CREA Apply 1 application topically 2 (two) times daily. Apply to skin to maintain good moisturize level 01/23/16   Vassie Loll, MD    Blood pressure (!) 134/102, pulse 64, temperature 97.5 F (36.4 C), temperature source Axillary, resp. rate 18, SpO2 98 %. Physical Exam: General: cachectic white female who is laying in bed and appears very uncomfortable but no acute distress Neck: Normal range of motion. Neck supple.   HEENT: head is normocephalic, atraumatic.  Sclera are noninjected.  Mouth is pink and moist Lungs: Respiratory effort nonlabored Abd: soft, NT/ND MS: pitting edema noted equally in bilateral lower extremities with a dressing noted to the LLE all 4 extremities are symmetrical with no cyanosis, clubbing, or edema. Skin: warm and dry with no masses, lesions, or rashes Psych: alert, unable to assess orientation GU: complete rectal prolapse that will not remain reduced, patient straining during attempt to reduce  No results found for this or any previous visit (from the past 48 hour(s)). No results found.    Assessment/Plan Rectal prolapse - rectum reducible in ED but immediately prolapses after reduction  Plan - I saw and evaluated this patient with Dr. Abbey Chatters. Due to her dementia she is not a good operative candidate; this would not improve her quality of life. Recommend using ice and/or sugar to reduce swelling. Avoid getting constipated by taking laxatives or stool softeners. Stay hydrated. Eat a high fiber diet. Ok to push the rectum back in as needed. No surgery follow-up needed.  [name] A MILLER,  [facility] 03/13/2016, 2:26 PM Pager: [phone] Consults: [phone] Mon-Fri 7:00 am-4:30 pm Sat-Sun 7:00 am-11:30 am

## 2016-03-13 NOTE — ED Notes (Signed)
Bed: [id number] Expected date: 03/13/16 Expected time: 12:12 PM Means of arrival:  Comments: prolapsed rectum

## 2016-03-13 NOTE — ED Triage Notes (Addendum)
[facility]. Family reported to family that patient went to bathroom week ago and c/o pain since, family thought was hemorrhoid but home health nurse told them today it's not hemorrhoid.    Patient also been having watery stools.  Patient speak louse. Patient house dementia.  Patient has swelling of hands, bilat legs and left eye which are being treated for by PCP and home health.

## 2016-03-13 NOTE — ED Provider Notes (Signed)
[facility]-EMERGENCY DEPT Provider Note   CSN: [id number] Arrival date & time: 03/13/16  1220     History   Chief Complaint Chief Complaint  Patient presents with  . prolapsed rectum    HPI Beth Hampton is a 83 y.o. female.  Patient with hx of dementia from home with chief complaint of rectal prolapse.  Per family, patient had some pain after a BM 1 week ago.  Complains of some loose watery stools.  Some swelling of bilateral hands and feet, being treated by PCP.  Unable to contribute to hx due to dementia.  Level 5 Caveat applies.   The history is provided by the patient. No language interpreter was used.    Past Medical History:  Diagnosis Date  . Dementia   . Murmur     Patient Active Problem List   Diagnosis Date Noted  . Generalized weakness 01/23/2016  . Fall (on) (from) other stairs and steps, initial encounter 01/23/2016  . Hypothermia 01/23/2016  . Bradycardia 01/23/2016  . Anemia 01/23/2016  . Dementia 01/23/2016  . Hypokalemia 01/23/2016  . Cellulitis of left lower leg   . Contusion of face   . Fall in elderly patient     History reviewed. No pertinent surgical history.  OB History    No data available       Home Medications    Prior to Admission medications   Medication Sig Start Date End Date Taking? Authorizing Provider  acetaminophen (TYLENOL) 325 MG tablet Take 2 tablets (650 mg total) by mouth every 6 (six) hours as needed for mild pain (or Fever >/= 101). 01/23/16   Vassie Lollarlos Madera, MD  doxycycline (VIBRA-TABS) 100 MG tablet Take 1 tablet (100 mg total) by mouth every 12 (twelve) hours. 01/23/16   Vassie Lollarlos Madera, MD  feeding supplement, ENSURE ENLIVE, (ENSURE ENLIVE) LIQD Take 237 mLs by mouth 2 (two) times daily between meals. 01/23/16   Vassie Lollarlos Madera, MD  hydrocerin (EUCERIN) CREA Apply 1 application topically 2 (two) times daily. Apply to skin to maintain good moisturize level 01/23/16   Vassie Lollarlos Madera, MD    Family History No  family history on file.  Social History Social History  Substance Use Topics  . Smoking status: Never Smoker  . Smokeless tobacco: Current User    Types: Chew  . Alcohol use No     Allergies   Patient has no known allergies.   Review of Systems Review of Systems  Unable to perform ROS: Dementia     Physical Exam Updated Vital Signs BP (!) 134/102   Pulse 64   Temp 97.5 F (36.4 C) (Axillary)   Resp 18   SpO2 98%   Physical Exam  Constitutional: She is oriented to person, place, and time. She appears well-developed and well-nourished.  HENT:  Head: Normocephalic and atraumatic.  Eyes: Conjunctivae and EOM are normal. Pupils are equal, round, and reactive to light.  Neck: Normal range of motion. Neck supple.  Cardiovascular: Normal rate and regular rhythm.  Exam reveals no gallop and no friction rub.   No murmur heard. Pulmonary/Chest: Effort normal and breath sounds normal. No respiratory distress. She has no wheezes. She has no rales. She exhibits no tenderness.  Abdominal: Soft. Bowel sounds are normal. She exhibits no distension and no mass. There is no tenderness. There is no rebound and no guarding.  Genitourinary:  Genitourinary Comments: Complete rectal prolapse  Musculoskeletal: Normal range of motion. She exhibits edema. She exhibits no tenderness.  Bilateral pitting edema  in lower extremities  Neurological: She is alert and oriented to person, place, and time.  Skin: Skin is warm and dry.  Psychiatric: She has a normal mood and affect. Her behavior is normal. Judgment and thought content normal.  Nursing note and vitals reviewed.    ED Treatments / Results  Labs (all labs ordered are listed, but only abnormal results are displayed) Labs Reviewed - No data to display  EKG  EKG Interpretation None       Radiology No results found.  Procedures Procedures (including critical care time)  Medications Ordered in ED Medications - No data to  display   Initial Impression / Assessment and Plan / ED Course  I have reviewed the triage vital signs and the nursing notes.  Pertinent labs & imaging results that were available during my care of the patient were reviewed by me and considered in my medical decision making (see chart for details).     Patient with rectal prolapse. Easily reducible, but immediately prolapses after reduction. Some watery stools.  Some abdominal cramps.  Will consult general surgery for recommendations.  General surgery recommends no surgical intervention.  Advises high fiber diet and cool compresses.  Seen by and discussed with Dr. Rubin Payor, who agrees with the plan.  Final Clinical Impressions(s) / ED Diagnoses   Final diagnoses:  Rectal prolapse    New Prescriptions New Prescriptions   No medications on file     Roxy Horseman, PA-C 03/13/16 1500    Benjiman Core, MD 03/13/16 1606

## 2016-03-13 NOTE — Discharge Instructions (Addendum)
Keep swelling down with ice. You can also sprinkle sugar on the tissue to help decrease swelling. Avoid getting constipated by taking laxatives or stool softeners Stay hydrated Eat a high fiber diet. Ok to push the rectum back in as needed.   Rectal Prolapse, Adult Introduction Rectal prolapse happens when the inside of the final section of the large intestine (rectum) pushes out through the anal opening. With this condition, the lower part of the rectum turns inside out. At first, rectal prolapse may be temporary. It may happen only when you are having a bowel movement. Over time, the prolapse will likely get worse. It may start to happen more often and cause uncomfortable symptoms. Eventually, the prolapse may happen when you are walking or simply standing. Surgery is often needed for this condition. What are the causes? This condition may result from weakness of the muscles that attach the rectum to the inside of the lower abdomen. The exact cause of this muscle weakness is not known. What increases the risk? This condition is more likely to develop in:  Women who are 20 years of age or older.  People with a history of constipation.  People with a history of hemorrhoids.  People who have a lower spinal cord injury.  Women who have been pregnant many times.  People who have had rectal surgery.  Men who have an enlarged prostate gland.  People who have chronic obstructive pulmonary disease (COPD).  People who have cystic fibrosis. What are the signs or symptoms? The main symptom of this condition is a red bump of tissue sticking out from your anus. At first, the bump may only appear after a bowel movement. It may then start to appear more often. Other symptoms may include:  Discomfort in the anus and rectum.  Constipation.  Diarrhea.  Inability to control bowel movements (incontinence).  Rectal bleeding. How is this diagnosed? This condition may be diagnosed based on  your symptoms and a physical exam. During the exam, you may be asked to squat and strain as though you are having a bowel movement. You may also have tests, such as:  A rectal exam using a flexible scope (sigmoidoscopy or colonoscopy).  A procedure that involves taking X-rays of your rectum after a dye (contrast material) is injected into the rectum (defecogram). How is this treated? This condition is usually treated with surgery to repair the weakened muscles and to reconnect the rectum to attachments inside the lower abdomen. Other treatment options may include:  Pushing the prolapsed area back into the rectum (reduction). Your health care provider may do this by gently pushing it back in using a moist cloth. The health care provider may also show you how to do this at home if the prolapse occurs again.  Medicines to prevent constipation and straining. This may include laxatives or stool softeners. Follow these instructions at home: General instructions  Take over-the-counter and prescription medicines only as told by your health care provider.  Do not strain to have a bowel movement.  Do not lift anything that is heavier than 10 lb (4.5 kg).  Follow instructions from your health care provider about what to do if the prolapse occurs again and does not go back in. This may involve lying on your side and using a moist cloth to gently press the lump into your rectum.  Keep all follow-up visits as told by your health care provider. This is important. Preventing Constipation  Eat foods that have a lot of  fiber, such as fruits, vegetables, whole grains, and beans.  Limit foods high in fat and processed sugars, such as french fries, hamburgers, cookies, candies, and soda.  Drink enough fluids to keep your urine clear or pale yellow. Contact a health care provider if:  You have a fever.  Your prolapse cannot be reduced at home.  You have constipation or diarrhea.  You have mild rectal  bleeding. Get help right away if:  You have very bad rectal pain.  You bleed heavily from your rectum. This information is not intended to replace advice given to you by your health care provider. Make sure you discuss any questions you have with your health care provider. Document Released: 10/03/2014 Document Revised: 06/20/2015 Document Reviewed: 01/31/2014  2017 [employer]

## 2016-05-30 ENCOUNTER — Encounter (HOSPITAL_COMMUNITY): Payer: Self-pay | Admitting: Emergency Medicine

## 2016-05-30 ENCOUNTER — Emergency Department (HOSPITAL_COMMUNITY): Payer: Medicare Other

## 2016-05-30 ENCOUNTER — Inpatient Hospital Stay (HOSPITAL_COMMUNITY)
Admission: EM | Admit: 2016-05-30 | Discharge: 2016-06-01 | DRG: 871 | Disposition: A | Discharge status: Hospice | Payer: Medicare Other | Attending: Internal Medicine | Admitting: Internal Medicine

## 2016-05-30 DIAGNOSIS — K7689 Other specified diseases of liver: Secondary | ICD-10-CM | POA: Diagnosis not present

## 2016-05-30 DIAGNOSIS — F17220 Nicotine dependence, chewing tobacco, uncomplicated: Secondary | ICD-10-CM | POA: Diagnosis present

## 2016-05-30 DIAGNOSIS — Z681 Body mass index (BMI) 19 or less, adult: Secondary | ICD-10-CM | POA: Diagnosis not present

## 2016-05-30 DIAGNOSIS — Z515 Encounter for palliative care: Secondary | ICD-10-CM

## 2016-05-30 DIAGNOSIS — R627 Adult failure to thrive: Secondary | ICD-10-CM | POA: Diagnosis not present

## 2016-05-30 DIAGNOSIS — E870 Hyperosmolality and hypernatremia: Secondary | ICD-10-CM | POA: Diagnosis present

## 2016-05-30 DIAGNOSIS — Z7189 Other specified counseling: Secondary | ICD-10-CM | POA: Diagnosis not present

## 2016-05-30 DIAGNOSIS — E860 Dehydration: Secondary | ICD-10-CM | POA: Diagnosis present

## 2016-05-30 DIAGNOSIS — T68XXXA Hypothermia, initial encounter: Secondary | ICD-10-CM | POA: Diagnosis not present

## 2016-05-30 DIAGNOSIS — G9341 Metabolic encephalopathy: Secondary | ICD-10-CM | POA: Diagnosis present

## 2016-05-30 DIAGNOSIS — E162 Hypoglycemia, unspecified: Secondary | ICD-10-CM | POA: Diagnosis present

## 2016-05-30 DIAGNOSIS — R7989 Other specified abnormal findings of blood chemistry: Secondary | ICD-10-CM | POA: Diagnosis present

## 2016-05-30 DIAGNOSIS — Z66 Do not resuscitate: Secondary | ICD-10-CM | POA: Diagnosis present

## 2016-05-30 DIAGNOSIS — N179 Acute kidney failure, unspecified: Secondary | ICD-10-CM | POA: Diagnosis not present

## 2016-05-30 DIAGNOSIS — N183 Chronic kidney disease, stage 3 (moderate): Secondary | ICD-10-CM | POA: Diagnosis present

## 2016-05-30 DIAGNOSIS — J189 Pneumonia, unspecified organism: Secondary | ICD-10-CM | POA: Diagnosis not present

## 2016-05-30 DIAGNOSIS — A419 Sepsis, unspecified organism: Principal | ICD-10-CM | POA: Diagnosis present

## 2016-05-30 DIAGNOSIS — F0390 Unspecified dementia without behavioral disturbance: Secondary | ICD-10-CM | POA: Diagnosis not present

## 2016-05-30 DIAGNOSIS — E43 Unspecified severe protein-calorie malnutrition: Secondary | ICD-10-CM | POA: Diagnosis not present

## 2016-05-30 DIAGNOSIS — R531 Weakness: Secondary | ICD-10-CM | POA: Diagnosis present

## 2016-05-30 DIAGNOSIS — R945 Abnormal results of liver function studies: Secondary | ICD-10-CM | POA: Diagnosis present

## 2016-05-30 DIAGNOSIS — D61818 Other pancytopenia: Secondary | ICD-10-CM | POA: Diagnosis present

## 2016-05-30 LAB — URINALYSIS, ROUTINE W REFLEX MICROSCOPIC
BILIRUBIN URINE: NEGATIVE
GLUCOSE, UA: NEGATIVE mg/dL
HGB URINE DIPSTICK: NEGATIVE
KETONES UR: NEGATIVE mg/dL
Leukocytes, UA: NEGATIVE
Nitrite: NEGATIVE
PH: 5 (ref 5.0–8.0)
Protein, ur: NEGATIVE mg/dL
SPECIFIC GRAVITY, URINE: 1.016 (ref 1.005–1.030)

## 2016-05-30 LAB — COMPREHENSIVE METABOLIC PANEL
ALK PHOS: 295 U/L — AB (ref 38–126)
ALT: 169 U/L — ABNORMAL HIGH (ref 14–54)
AST: 463 U/L — AB (ref 15–41)
Albumin: 2.4 g/dL — ABNORMAL LOW (ref 3.5–5.0)
Anion gap: 8 (ref 5–15)
BILIRUBIN TOTAL: 1.3 mg/dL — AB (ref 0.3–1.2)
BUN: 78 mg/dL — AB (ref 6–20)
CALCIUM: 7.4 mg/dL — AB (ref 8.9–10.3)
CO2: 30 mmol/L (ref 22–32)
CREATININE: 1.18 mg/dL — AB (ref 0.44–1.00)
Chloride: 113 mmol/L — ABNORMAL HIGH (ref 101–111)
GFR calc Af Amer: 47 mL/min — ABNORMAL LOW (ref >60)
GFR, EST NON AFRICAN AMERICAN: 40 mL/min — AB (ref >60)
Glucose, Bld: 114 mg/dL — ABNORMAL HIGH (ref 65–99)
POTASSIUM: 3.9 mmol/L (ref 3.5–5.1)
Sodium: 151 mmol/L — ABNORMAL HIGH (ref 135–145)
TOTAL PROTEIN: 5.3 g/dL — AB (ref 6.5–8.1)

## 2016-05-30 LAB — CBC WITH DIFFERENTIAL/PLATELET
Basophils Absolute: 0 10*3/uL (ref 0.0–0.1)
Basophils Relative: 0 %
EOS PCT: 0 %
Eosinophils Absolute: 0 10*3/uL (ref 0.0–0.7)
HEMATOCRIT: 31.8 % — AB (ref 36.0–46.0)
HEMOGLOBIN: 10.8 g/dL — AB (ref 12.0–15.0)
Lymphocytes Relative: 12 %
Lymphs Abs: 0.4 10*3/uL — ABNORMAL LOW (ref 0.7–4.0)
MCH: 25.8 pg — ABNORMAL LOW (ref 26.0–34.0)
MCHC: 34 g/dL (ref 30.0–36.0)
MCV: 75.9 fL — AB (ref 78.0–100.0)
MONOS PCT: 5 %
Monocytes Absolute: 0.2 10*3/uL (ref 0.1–1.0)
NEUTROS ABS: 2.9 10*3/uL (ref 1.7–7.7)
Neutrophils Relative %: 83 %
Platelets: 84 10*3/uL — ABNORMAL LOW (ref 150–400)
RBC: 4.19 MIL/uL (ref 3.87–5.11)
RDW: 16.6 % — AB (ref 11.5–15.5)
WBC: 3.5 10*3/uL — AB (ref 4.0–10.5)

## 2016-05-30 LAB — I-STAT TROPONIN, ED: Troponin i, poc: 0.02 ng/mL (ref 0.00–0.08)

## 2016-05-30 LAB — GLUCOSE, CAPILLARY: Glucose-Capillary: 176 mg/dL — ABNORMAL HIGH (ref 65–99)

## 2016-05-30 LAB — I-STAT CHEM 8, ED
BUN: 71 mg/dL — ABNORMAL HIGH (ref 6–20)
CHLORIDE: 111 mmol/L (ref 101–111)
CREATININE: 1.5 mg/dL — AB (ref 0.44–1.00)
Calcium, Ion: 0.86 mmol/L — CL (ref 1.15–1.40)
GLUCOSE: 111 mg/dL — AB (ref 65–99)
HEMATOCRIT: 36 % (ref 36.0–46.0)
Hemoglobin: 12.2 g/dL (ref 12.0–15.0)
POTASSIUM: 3.7 mmol/L (ref 3.5–5.1)
Sodium: 148 mmol/L — ABNORMAL HIGH (ref 135–145)
TCO2: 31 mmol/L (ref 0–100)

## 2016-05-30 LAB — I-STAT CG4 LACTIC ACID, ED: LACTIC ACID, VENOUS: 1.11 mmol/L (ref 0.5–1.9)

## 2016-05-30 LAB — CBG MONITORING, ED: Glucose-Capillary: 140 mg/dL — ABNORMAL HIGH (ref 65–99)

## 2016-05-30 LAB — TSH: TSH: 1.056 u[IU]/mL (ref 0.350–4.500)

## 2016-05-30 LAB — AMMONIA: Ammonia: 30 umol/L (ref 9–35)

## 2016-05-30 MED ORDER — HEPARIN SODIUM (PORCINE) 5000 UNIT/ML IJ SOLN
5000.0000 [IU] | Freq: Three times a day (TID) | INTRAMUSCULAR | Status: DC
  Administered 2016-05-30 – 2016-05-31 (×2): 5000 [IU] via SUBCUTANEOUS
  Filled 2016-05-30 (×2): qty 1

## 2016-05-30 MED ORDER — DEXTROSE-NACL 5-0.45 % IV SOLN
INTRAVENOUS | Status: DC
  Administered 2016-05-30 – 2016-06-01 (×3): via INTRAVENOUS

## 2016-05-30 MED ORDER — SODIUM CHLORIDE 0.9 % IV SOLN
1.0000 g | Freq: Once | INTRAVENOUS | Status: AC
  Administered 2016-05-30: 1 g via INTRAVENOUS
  Filled 2016-05-30: qty 10

## 2016-05-30 MED ORDER — VANCOMYCIN HCL 500 MG IV SOLR
500.0000 mg | INTRAVENOUS | Status: DC
  Filled 2016-05-30: qty 500

## 2016-05-30 MED ORDER — HYDROCERIN EX CREA
1.0000 "application " | TOPICAL_CREAM | Freq: Two times a day (BID) | CUTANEOUS | Status: DC
  Administered 2016-05-31 – 2016-06-01 (×3): 1 via TOPICAL
  Filled 2016-05-30: qty 113

## 2016-05-30 MED ORDER — PIPERACILLIN-TAZOBACTAM 3.375 G IVPB
3.3750 g | Freq: Three times a day (TID) | INTRAVENOUS | Status: DC
  Administered 2016-05-31 (×2): 3.375 g via INTRAVENOUS
  Filled 2016-05-30 (×4): qty 50

## 2016-05-30 MED ORDER — PIPERACILLIN-TAZOBACTAM 3.375 G IVPB 30 MIN
3.3750 g | INTRAVENOUS | Status: AC
  Administered 2016-05-30: 3.375 g via INTRAVENOUS
  Filled 2016-05-30: qty 50

## 2016-05-30 MED ORDER — CIPROFLOXACIN HCL 0.3 % OP SOLN
1.0000 [drp] | OPHTHALMIC | Status: DC
  Administered 2016-05-30 – 2016-06-01 (×11): 1 [drp] via OPHTHALMIC
  Filled 2016-05-30: qty 2.5

## 2016-05-30 MED ORDER — DEXTROSE 50 % IV SOLN
1.0000 | Freq: Once | INTRAVENOUS | Status: AC
  Administered 2016-05-30: 50 mL via INTRAVENOUS

## 2016-05-30 MED ORDER — IOPAMIDOL (ISOVUE-300) INJECTION 61%
75.0000 mL | Freq: Once | INTRAVENOUS | Status: AC | PRN
  Administered 2016-05-30: 75 mL via INTRAVENOUS

## 2016-05-30 MED ORDER — VANCOMYCIN HCL IN DEXTROSE 750-5 MG/150ML-% IV SOLN
750.0000 mg | Freq: Once | INTRAVENOUS | Status: AC
  Administered 2016-05-30: 750 mg via INTRAVENOUS
  Filled 2016-05-30: qty 150

## 2016-05-30 MED ORDER — AZITHROMYCIN 500 MG IV SOLR
500.0000 mg | INTRAVENOUS | Status: DC
  Administered 2016-05-30: 500 mg via INTRAVENOUS
  Filled 2016-05-30: qty 500

## 2016-05-30 MED ORDER — SODIUM CHLORIDE 0.9 % IV BOLUS (SEPSIS)
1000.0000 mL | Freq: Once | INTRAVENOUS | Status: AC
  Administered 2016-05-30: 1000 mL via INTRAVENOUS

## 2016-05-30 MED ORDER — LACTATED RINGERS IV SOLN: INTRAVENOUS | Status: DC

## 2016-05-30 NOTE — ED Notes (Signed)
One set of blood cultures obtained prior to antibiotic administration. Unable to obtain second set. Antibiotics started as cannot delay pt care.

## 2016-05-30 NOTE — ED Notes (Signed)
Pts niece would like to be contacted when more information on pt placement is known: [phone]. Pt niece lives w/ her and speaks english is to be contacted for questions related to pt.

## 2016-05-30 NOTE — ED Triage Notes (Signed)
Pt brought in from home Per EMS family states pt is more lethargic than usual. Pt failure to thrive. Family states they cannot get her to eat and unsure  Last time she has eaten. Pt hx of dementia. Pt has had 350 mL NS and D10 in route as pts initial CBG was 49. Current CBG 295.

## 2016-05-30 NOTE — ED Notes (Signed)
Erma HeritageIsaacs, MD made aware of pt repeat rectal temp

## 2016-05-30 NOTE — ED Provider Notes (Signed)
[facility]-EMERGENCY DEPT Provider Note   CSN: [id number] Arrival date & time: 05/30/16  1514     History   Chief Complaint Chief Complaint  Patient presents with  . Failure To Thrive    HPI Beth Hampton is a 83 y.o. female.  HPI   83 yo F with PMHx of dementia, hypokalemia, failure to thrive, previously on Hospice but no longer on Hospice here with failure to thrive. Per report from family, pt has had progressively worsening facial swelling for the past several weeks. She has also had increasing edema of her b/l arms and legs. She has also begun to c/o severe fatigue and tiredness. Over the past several days, she has decreased and now stopped eating. Family c/f her health. She is more drowsy than usual. No cough. No fevers. History otherwise limited 2/2 dementia.  Level 5 caveat invoked as remainder of history, ROS, and physical exam limited due to patient's dementia.   Past Medical History:  Diagnosis Date  . Dementia   . Murmur     Patient Active Problem List   Diagnosis Date Noted  . Hypernatremia 05/30/2016  . ARF (acute renal failure) (HCC) 05/30/2016  . Community acquired pneumonia 05/30/2016  . Sepsis (HCC) 05/30/2016  . Abnormal liver function 05/30/2016  . Generalized weakness 01/23/2016  . Fall (on) (from) other stairs and steps, initial encounter 01/23/2016  . Hypothermia 01/23/2016  . Bradycardia 01/23/2016  . Anemia 01/23/2016  . Dementia 01/23/2016  . Hypokalemia 01/23/2016  . Cellulitis of left lower leg   . Contusion of face   . Fall in elderly patient     History reviewed. No pertinent surgical history.  OB History    No data available       Home Medications    Prior to Admission medications   Medication Sig Start Date End Date Taking? Authorizing Provider  ciprofloxacin (CILOXAN) 0.3 % ophthalmic solution Place 1 drop into both eyes every 2 (two) hours while awake. For 2 days, then 1 drop every 4 hours while awake for 5 days 04/10/16   Yes [provider]  erythromycin ophthalmic ointment Place 1 application into both eyes 3 (three) times daily. 05/19/16  Yes [provider]  gabapentin (NEURONTIN) 300 MG capsule Take 300 mg by mouth 3 (three) times daily. 04/07/16  Yes [provider]  meloxicam (MOBIC) 7.5 MG tablet Take 7.5 mg by mouth daily. 04/07/16  Yes [provider]  acetaminophen (TYLENOL) 325 MG tablet Take 2 tablets (650 mg total) by mouth every 6 (six) hours as needed for mild pain (or Fever >/= 101). 01/23/16   Vassie Loll, MD  doxycycline (VIBRA-TABS) 100 MG tablet Take 1 tablet (100 mg total) by mouth every 12 (twelve) hours. 01/23/16   Vassie Loll, MD  feeding supplement, ENSURE ENLIVE, (ENSURE ENLIVE) LIQD Take 237 mLs by mouth 2 (two) times daily between meals. 01/23/16   Vassie Loll, MD  hydrocerin (EUCERIN) CREA Apply 1 application topically 2 (two) times daily. Apply to skin to maintain good moisturize level 01/23/16   Vassie Loll, MD    Family History Family History  Problem Relation Age of Onset  . Family history unknown: Yes    Social History Social History  Substance Use Topics  . Smoking status: Never Smoker  . Smokeless tobacco: Current User    Types: Chew  . Alcohol use No     Allergies   Patient has no known allergies.   Review of Systems Review of Systems  Unable to perform ROS: Dementia     Physical Exam Updated Vital Signs BP 132/67   Pulse (!) 53   Temp (!) 92.9 F (33.8 C) (Rectal)   Resp 10   Ht 5\' 2"  (1.575 m)   Wt 79 lb (35.8 kg)   SpO2 100%   BMI 14.45 kg/m   Physical Exam  Constitutional: She appears well-developed. She appears distressed.  Cachectic, malnourished, diffusely edematous  HENT:  Head: Normocephalic and atraumatic.  Marked facial edema, causing obstruction of bl eyes. Dry MM  Eyes: Conjunctivae are normal.  Neck: Neck supple.  Cardiovascular: Regular rhythm and normal heart sounds.  Bradycardia  present.  Exam reveals no friction rub.   No murmur heard. Pulmonary/Chest: Effort normal and breath sounds normal. No respiratory distress. She has no wheezes. She has no rales.  Abdominal: Soft. She exhibits no distension.  Musculoskeletal: She exhibits edema.  2+ pitting edema b/l UE and LE. Pallor and cyanosis of distal fingers and hands bilaterally.  Neurological: She is alert. She exhibits normal muscle tone.  Drowsy but easily aroused.  Skin: Skin is warm. Capillary refill takes 2 to 3 seconds.  Nursing note and vitals reviewed.    ED Treatments / Results  Labs (all labs ordered are listed, but only abnormal results are displayed) Labs Reviewed  CBC WITH DIFFERENTIAL/PLATELET - Abnormal; Notable for the following:       Result Value   WBC 3.5 (*)    Hemoglobin 10.8 (*)    HCT 31.8 (*)    MCV 75.9 (*)    MCH 25.8 (*)    RDW 16.6 (*)    Platelets 84 (*)    Lymphs Abs 0.4 (*)    All other components within normal limits  COMPREHENSIVE METABOLIC PANEL - Abnormal; Notable for the following:    Sodium 151 (*)    Chloride 113 (*)    Glucose, Bld 114 (*)    BUN 78 (*)    Creatinine, Ser 1.18 (*)    Calcium 7.4 (*)    Total Protein 5.3 (*)    Albumin 2.4 (*)    AST 463 (*)    ALT 169 (*)    Alkaline Phosphatase 295 (*)    Total Bilirubin 1.3 (*)    GFR calc non Af Amer 40 (*)    GFR calc Af Amer 47 (*)    All other components within normal limits  URINALYSIS, ROUTINE W REFLEX MICROSCOPIC - Abnormal; Notable for the following:    APPearance HAZY (*)    All other components within normal limits  CBG MONITORING, ED - Abnormal; Notable for the following:    Glucose-Capillary 140 (*)    All other components within normal limits  I-STAT CHEM 8, ED - Abnormal; Notable for the following:    Sodium 148 (*)    BUN 71 (*)    Creatinine, Ser 1.50 (*)    Glucose, Bld 111 (*)    Calcium, Ion 0.86 (*)    All other components within normal limits  CULTURE, BLOOD (ROUTINE X 2)    CULTURE, BLOOD (ROUTINE X 2)  TSH  CREATININE, SERUM  AMMONIA  I-STAT CG4 LACTIC ACID, ED  I-STAT TROPOININ, ED  I-STAT CG4 LACTIC ACID, ED    EKG  EKG Interpretation  Date/Time:  Saturday May 30 2016 16:01:14 EDT Ventricular Rate:  52 PR Interval:    QRS Duration: 127 QT Interval:  555 QTC Calculation: 517 R Axis:   106 Text Interpretation:  Sinus rhythm RBBB and LPFB Consider left ventricular hypertrophy Prolonged QT interval No significant change since last tracing Confirmed by Charlesetta Milliron MD, Kentaro Alewine [phone]) on 05/30/2016 4:05:07 PM       Radiology Dg Chest 2 View  Result Date: 05/30/2016 CLINICAL DATA:  Failure to thrive.  Hypothermia EXAM: CHEST  2 VIEW COMPARISON:  01/22/2016 FINDINGS: Asymmetric airspace disease on the left, presumably pneumonia. No definitive volume loss when accounting for rotation. No effusion or edema. Chronic cardiomegaly. Remote posterior right rib fractures based on prior. No acute osseous finding. IMPRESSION: Asymmetric opacity at the left base favoring pneumonia. Electronically Signed   By: Marnee Spring M.D.   On: 05/30/2016 17:22   Ct Abdomen Pelvis W Contrast  Result Date: 05/30/2016 CLINICAL DATA:  83 year old female with abdominal pain, sepsis and lethargy. History of dementia. EXAM: CT ABDOMEN AND PELVIS WITH CONTRAST TECHNIQUE: Multidetector CT imaging of the abdomen and pelvis was performed using the standard protocol following bolus administration of intravenous contrast. CONTRAST:  75mL ISOVUE-300 IOPAMIDOL (ISOVUE-300) INJECTION 61% COMPARISON:  None. FINDINGS: Sensitivity is decreased due to paucity of fat and mild diffuse edema. Lower chest: Cardiomegaly and small bilateral pleural effusions are noted. Mild bibasilar opacities are ill-defined and may represent atelectasis versus airspace disease/ pneumonia. Hepatobiliary: No definite hepatic abnormality. The gallbladder is difficult to identified. Pancreas: No definite abnormality Spleen:  Unremarkable Adrenals/Urinary Tract: Bladder distention noted. Bilateral renal cortical atrophy identified. No definite adrenal abnormality. Stomach/Bowel: No definite abnormality. Vascular/Lymphatic: Aortic atherosclerotic calcifications noted without aneurysm. No definite enlarged lymph nodes. Reproductive: No definite abnormality Other: Diffuse subcutaneous and mesenteric edema noted. No focal collection identified. Musculoskeletal: No acute abnormality or suspicious focal lesion. Multilevel degenerative changes within the lumbar spine noted. IMPRESSION: Diffuse subcutaneous and mesenteric edema with paucity of fat, limiting evaluation. No other acute abnormality identified within the abdomen/pelvis. Small bilateral pleural effusions with mild bibasilar opacities -question atelectasis versus airspace disease/ pneumonia. Bladder distension. Abdominal aortic atherosclerosis. Cardiomegaly. Electronically Signed   By: Harmon Pier M.D.   On: 05/30/2016 19:36    Procedures .Critical Care Performed by: Shaune Pollack Authorized by: Shaune Pollack   Critical care provider statement:    Critical care time (minutes):  45   Critical care was necessary to treat or prevent imminent or life-threatening deterioration of the following conditions:  Circulatory failure, sepsis and metabolic crisis   Critical care was time spent personally by me on the following activities:  Development of treatment plan with patient or surrogate, discussions with consultants, evaluation of patient's response to treatment, examination of patient, obtaining history from patient or surrogate, ordering and performing treatments and interventions, ordering and review of laboratory studies, ordering and review of radiographic studies, pulse oximetry, re-evaluation of patient's condition and review of old charts   I assumed direction of critical care for this patient from another provider in my specialty: no     (including critical care  time)  Medications Ordered in ED Medications  piperacillin-tazobactam (ZOSYN) IVPB 3.375 g (not administered)  vancomycin (VANCOCIN) 500 mg in sodium chloride 0.9 % 100 mL IVPB (not administered)  dextrose 5 %-0.45 % sodium chloride infusion (not administered)  sodium chloride 0.9 % bolus 1,000 mL (0 mLs Intravenous Stopped 05/30/16 1731)  calcium gluconate 1 g in sodium chloride 0.9 % 100 mL IVPB (0 g Intravenous Stopped 05/30/16 1748)  vancomycin (VANCOCIN) IVPB 750 mg/150 ml premix (0 mg Intravenous Stopped 05/30/16 2022)  piperacillin-tazobactam (ZOSYN) IVPB 3.375 g (0 g Intravenous Stopped 05/30/16 1854)  iopamidol (ISOVUE-300) 61 % injection 75 mL (75 mLs Intravenous Contrast Given 05/30/16 1925)     Initial Impression / Assessment and Plan / ED Course  I have reviewed the triage vital signs and the nursing notes.  Pertinent labs & imaging results that were available during my care of the patient were reviewed by me and considered in my medical decision making (see chart for details).    83 yo F with PMHx as above here with failure to thrive. On exam, pt markedly edematous, ill-appearing, drowsy but arousable. She is intravascularly dry but edematous, likely 2/2 hypoalbuminemia versus renal failure. Will check broad labs, place on [employer]. TSH added as well. Will likely need admission and family is in agreement with this.  Labs and imaging as above. Patient remains markedly hypothermic. She is nearly pancytopenic and given hyperthermia, will cover for possible sepsis with broad-spectrum antibiotics. Will hold on aggressive fluids given her markedly edema, although she does appear intravascularly dry with significant hypernatremia and acute kidney injury on labs. Will continue significant warming efforts and admit to stepdown. Family and agreement with this plan.   Of note, imaging does show pneumonia and she has been given appropriate antibiotics for this. CT abdomen without acute surgical  abnormality  Final Clinical Impressions(s) / ED Diagnoses   Final diagnoses:  AKI (acute kidney injury) (HCC)  Hypernatremia  Failure to thrive in adult  Sepsis, due to unspecified organism [facility])    New Prescriptions New Prescriptions   No medications on file     Shaune Pollack, MD 05/30/16 2100

## 2016-05-30 NOTE — ED Notes (Signed)
Bed: [id number] Expected date:  Expected time:  Means of arrival:  Comments: 83 yo FTT

## 2016-05-30 NOTE — ED Notes (Signed)
Fluids placed on fluid warmer as pt is hypothermic at 93 F. Pt is also on bear hugger at this time. Will recheck rectal temp when fluids complete.

## 2016-05-30 NOTE — Progress Notes (Signed)
Pharmacy Antibiotic Note  Beth Hampton is a 83 y.o. female admitted on 05/30/2016 with FTT and suspected sepsis. Pharmacy has been consulted for Vancomycin and Zosyn dosing. In December her weight was 39kg. SCr is elevated, estimated CrCl ~30N.  Plan: Vancomycin 750mg  x 1 then 500mg  IV q24h. (Goal trough 15-5520mcg/ml) Zosyn 3.375g IV Q8H infused over 4hrs. Measure Vanc trough at steady state. Follow up renal fxn, culture results, and clinical course.      Temp (24hrs), Avg:93 F (33.9 C), Min:93 F (33.9 C), Max:93 F (33.9 C)   Recent Labs Lab 05/30/16 1611 05/30/16 1621  WBC 3.5*  --   CREATININE 1.18* 1.50*  LATICACIDVEN  --  1.11    CrCl cannot be calculated (Unknown ideal weight.).    No Known Allergies  Antimicrobials this admission: Vanc 5/5 >>  Zosyn 5/5 >>   Dose adjustments this admission:   Microbiology results: 5/5 BCx: UCx:  Sputum:  MRSA PCR:  Thank you for allowing pharmacy to be a part of this patient's care.  Beth Hampton Beth Hampton, PharmD, pager [phone]. 05/30/2016,5:12 PM.

## 2016-05-30 NOTE — ED Notes (Signed)
Patient transported to CT 

## 2016-05-30 NOTE — H&P (Addendum)
TRH H&P   Patient Demographics:    Beth Hampton, is a 83 y.o. female  MRN: [id number]   DOB - January 13, 1927  Admit Date - 05/30/2016  Outpatient Primary MD for the patient is Patient, No Pcp Per  Referring MD/NP/PA: Adriana Mccallum  Outpatient Specialists:   Patient coming from: home  Chief Complaint  Patient presents with  . Failure To Thrive      HPI:    Beth Hampton  is a 83 y.o. female, w Dementia who is unable to provide any meaningful history.  Per her Niece she was lethargic today, and generally weak and not eating well and therefore she was brought to ED by her family.    In Ed, pt noted to be hypothermic. T 92.9,  Pt pancytopenic  Wbc 3.5, Hgb 10.8, Plt 84, and hypernatremic with Na=151, and mild ARF w bun/creatinine 71/1.5    CXR showed left lower lung  opacity.  CT scan abdomen/pelvis =>  Small bilateral pleural effusion, w mild bibasilar opacity ? Atelectasis vs pneumonia.  Pt will be admitted for sepsis, ? Secondary to pneumonia. Pt also has dusky toes likely due to hypoperfusion  I had long discussion with Niece about poor prognosis and she agrees w  Code Status DNR    Review of systems:    In addition to the HPI above, which was related by Niece No Fever-chills, No Headache, No changes with Vision or hearing, No problems swallowing food or Liquids, No Chest pain, Cough or Shortness of Breath, No Abdominal pain, No Nausea or Vommitting, slight diarrhea No Blood in stool or Urine, No dysuria, No new skin rashes or bruises, No new joints pains-aches,  No new weakness, tingling, numbness in any extremity, No recent weight gain or loss, No polyuria, polydypsia or polyphagia, No significant Mental Stressors.  A full 10 point Review of Systems was done, except as stated above, all other Review of Systems were negative.   With Past History of the  following :    Past Medical History:  Diagnosis Date  . Dementia   . Murmur       History reviewed. No pertinent surgical history.    Social History:     Social History  Substance Use Topics  . Smoking status: Never Smoker  . Smokeless tobacco: Current User    Types: Chew  . Alcohol use No     Lives - at home  Mobility -   Typically walks but not over the past 1 day per Niece   Family History :    History reviewed. No pertinent family history. Pt is unable to provide history due to dementia   Home Medications:   Prior to Admission medications   Medication Sig Start Date End Date Taking? Authorizing Provider  ciprofloxacin (CILOXAN) 0.3 % ophthalmic solution Place 1 drop into both eyes every 2 (two) hours while awake.  For 2 days, then 1 drop every 4 hours while awake for 5 days 04/10/16  Yes [provider]  erythromycin ophthalmic ointment Place 1 application into both eyes 3 (three) times daily. 05/19/16  Yes [provider]  gabapentin (NEURONTIN) 300 MG capsule Take 300 mg by mouth 3 (three) times daily. 04/07/16  Yes [provider]  meloxicam (MOBIC) 7.5 MG tablet Take 7.5 mg by mouth daily. 04/07/16  Yes [provider]  acetaminophen (TYLENOL) 325 MG tablet Take 2 tablets (650 mg total) by mouth every 6 (six) hours as needed for mild pain (or Fever >/= 101). 01/23/16   Barton Dubois, MD  doxycycline (VIBRA-TABS) 100 MG tablet Take 1 tablet (100 mg total) by mouth every 12 (twelve) hours. 01/23/16   Barton Dubois, MD  feeding supplement, ENSURE ENLIVE, (ENSURE ENLIVE) LIQD Take 237 mLs by mouth 2 (two) times daily between meals. 01/23/16   Barton Dubois, MD  hydrocerin (EUCERIN) CREA Apply 1 application topically 2 (two) times daily. Apply to skin to maintain good moisturize level 01/23/16   Barton Dubois, MD     Allergies:    No Known Allergies   Physical Exam:   Vitals  Blood pressure 132/67, pulse (!) 53, temperature  (!) 92.9 F (33.8 C), temperature source Rectal, resp. rate 10, height _0  (1.575 m), weight 35.8 kg (79 lb), SpO2 100 %.   1. General  lying in bed in slightly agitated  2.   Awake Alert, Oriented X 1.  3. No F.N deficits, ALL C.Nerves Intact, Strength 5/5 all 4 extremities, Sensation intact all 4 extremities, Plantars down going.  4. Ears and Eyes appear Normal, Conjunctivae clear, PERRLA. Moist Oral Mucosa.  5. Supple Neck, No JVD, No cervical lymphadenopathy appriciated, No Carotid Bruits.  6. Symmetrical Chest wall movement,  Crackles at left base, no wheezing  7. RRR, s1, s2, 2/6 sem apex   8. Positive Bowel Sounds, Abdomen Soft, No tenderness, No organomegaly appriciated,No rebound -guarding or rigidity.  9.  + bilateral 2+ lower ext edema. Toes are all dusky Difficult to feel dp pulse due to edema.   10. Good muscle tone,  joints appear normal , no effusions, Normal ROM.  11. No Palpable Lymph Nodes in Neck or Axillae     Data Review:    CBC  Recent Labs Lab 05/30/16 1611 05/30/16 1621  WBC 3.5*  --   HGB 10.8* 12.2  HCT 31.8* 36.0  PLT 84*  --   MCV 75.9*  --   MCH 25.8*  --   MCHC 34.0  --   RDW 16.6*  --   LYMPHSABS 0.4*  --   MONOABS 0.2  --   EOSABS 0.0  --   BASOSABS 0.0  --    ------------------------------------------------------------------------------------------------------------------  Chemistries   Recent Labs Lab 05/30/16 1611 05/30/16 1621  NA 151* 148*  K 3.9 3.7  CL 113* 111  CO2 30  --   GLUCOSE 114* 111*  BUN 78* 71*  CREATININE 1.18* 1.50*  CALCIUM 7.4*  --   AST 463*  --   ALT 169*  --   ALKPHOS 295*  --   BILITOT 1.3*  --    ------------------------------------------------------------------------------------------------------------------ estimated creatinine clearance is 14.9 mL/min (A) (by C-G formula based on SCr of 1.5 mg/dL  (H)). ------------------------------------------------------------------------------------------------------------------  Recent Labs  05/30/16 1611  TSH 1.056    Coagulation profile No results for input(s): INR, PROTIME in the last 168 hours. ------------------------------------------------------------------------------------------------------------------- No results for  input(s): DDIMER in the last 72 hours. -------------------------------------------------------------------------------------------------------------------  Cardiac Enzymes No results for input(s): CKMB, TROPONINI, MYOGLOBIN in the last 168 hours.  Invalid input(s): CK ------------------------------------------------------------------------------------------------------------------ No results found for: BNP   ---------------------------------------------------------------------------------------------------------------  Urinalysis    Component Value Date/Time   COLORURINE YELLOW 05/30/2016 1541   APPEARANCEUR HAZY (A) 05/30/2016 1541   LABSPEC 1.016 05/30/2016 1541   PHURINE 5.0 05/30/2016 1541   GLUCOSEU NEGATIVE 05/30/2016 1541   [place] 05/30/2016 [place] 05/30/2016 1541   KETONESUR NEGATIVE 05/30/2016 1541   PROTEINUR NEGATIVE 05/30/2016 1541   UROBILINOGEN 1.0 03/04/2009 1536   NITRITE NEGATIVE 05/30/2016 1541   LEUKOCYTESUR NEGATIVE 05/30/2016 1541    ----------------------------------------------------------------------------------------------------------------   Imaging Results:    Dg Chest 2 View  Result Date: 05/30/2016 CLINICAL DATA:  Failure to thrive.  Hypothermia EXAM: CHEST  2 VIEW COMPARISON:  01/22/2016 FINDINGS: Asymmetric airspace disease on the left, presumably pneumonia. No definitive volume loss when accounting for rotation. No effusion or edema. Chronic cardiomegaly. Remote posterior right rib fractures based on prior. No acute osseous finding.  IMPRESSION: Asymmetric opacity at the left base favoring pneumonia. Electronically Signed   By: Monte Fantasia M.D.   On: 05/30/2016 17:22   Ct Abdomen Pelvis W Contrast  Result Date: 05/30/2016 CLINICAL DATA:  83 year old female with abdominal pain, sepsis and lethargy. History of dementia. EXAM: CT ABDOMEN AND PELVIS WITH CONTRAST TECHNIQUE: Multidetector CT imaging of the abdomen and pelvis was performed using the standard protocol following bolus administration of intravenous contrast. CONTRAST:  34m ISOVUE-300 IOPAMIDOL (ISOVUE-300) INJECTION 61% COMPARISON:  None. FINDINGS: Sensitivity is decreased due to paucity of fat and mild diffuse edema. Lower chest: Cardiomegaly and small bilateral pleural effusions are noted. Mild bibasilar opacities are ill-defined and may represent atelectasis versus airspace disease/ pneumonia. Hepatobiliary: No definite hepatic abnormality. The gallbladder is difficult to identified. Pancreas: No definite abnormality Spleen: Unremarkable Adrenals/Urinary Tract: Bladder distention noted. Bilateral renal cortical atrophy identified. No definite adrenal abnormality. Stomach/Bowel: No definite abnormality. Vascular/Lymphatic: Aortic atherosclerotic calcifications noted without aneurysm. No definite enlarged lymph nodes. Reproductive: No definite abnormality Other: Diffuse subcutaneous and mesenteric edema noted. No focal collection identified. Musculoskeletal: No acute abnormality or suspicious focal lesion. Multilevel degenerative changes within the lumbar spine noted. IMPRESSION: Diffuse subcutaneous and mesenteric edema with paucity of fat, limiting evaluation. No other acute abnormality identified within the abdomen/pelvis. Small bilateral pleural effusions with mild bibasilar opacities -question atelectasis versus airspace disease/ pneumonia. Bladder distension. Abdominal aortic atherosclerosis. Cardiomegaly. Electronically Signed   By: JMargarette CanadaM.D.   On: 05/30/2016 19:36        Assessment & Plan:    Principal Problem:   Sepsis ([place] Active Problems:   Hypothermia   Dementia   Hypernatremia   ARF (acute renal failure) ([facility]   Community acquired pneumonia   Abnormal liver function    Sepsis secondary to pneumonia Blood culture pending Respiratory panel pending Sputum cx pending Urine strep antigen, urine legionella antigen vanco iv pharmacy to dose Zosyn iv pharmacy to dose zithromax 5010miv qday  Abnormal lft Tylenol level Acute hepatitis panel  Hypernatremia secondary to dehydration d5 1/2 ns iv  ARF Likely due to dehydration Hydrate gently with ns iv cmp in am  Pancytopenia Repeat cbc in am If worsening consider peripheral smear, retic, LDH,  spep, upep, ana, esr, lyme, ehrlichia titer and  oncology consultation  Dementia End stage,  Code status DNR.   Dysphagia ? Speech therapy to evaluate NPO except  for Medication for now  Peripheral ischemia? Please check on her toes in the am.  ABI ordered  Cardiac murmer/ Peripheral edema Check cardiac echo  Severe protein calorie malnutrition Once able to take po consider Prostat  DVT Prophylaxis Heparin -  SCDs   AM Labs Ordered, also please review Full Orders  Family Communication: Admission, patients condition and plan of care including tests being ordered have been discussed with the patient and niece who indicate understanding and agree with the plan and Code Status.  Code Status DNR  Likely DC to  ?  Condition Critical  Consults called none  Admission status: inpatient    Time spent in minutes : 45 minutes   Jani Gravel M.D on 05/30/2016 at 8:55 PM  Between 7am to 7pm - Pager - [phone] After 7pm go to [email or web address] - password [facility]  [facility] - Office  [phone]

## 2016-05-31 ENCOUNTER — Encounter (HOSPITAL_COMMUNITY): Payer: Medicare Other

## 2016-05-31 DIAGNOSIS — R627 Adult failure to thrive: Secondary | ICD-10-CM

## 2016-05-31 DIAGNOSIS — E43 Unspecified severe protein-calorie malnutrition: Secondary | ICD-10-CM

## 2016-05-31 DIAGNOSIS — Z515 Encounter for palliative care: Secondary | ICD-10-CM

## 2016-05-31 DIAGNOSIS — Z7189 Other specified counseling: Secondary | ICD-10-CM

## 2016-05-31 LAB — RESPIRATORY PANEL BY PCR
Adenovirus: NOT DETECTED
BORDETELLA PERTUSSIS-RVPCR: NOT DETECTED
CHLAMYDOPHILA PNEUMONIAE-RVPPCR: NOT DETECTED
CORONAVIRUS 229E-RVPPCR: NOT DETECTED
CORONAVIRUS HKU1-RVPPCR: NOT DETECTED
CORONAVIRUS OC43-RVPPCR: NOT DETECTED
Coronavirus NL63: NOT DETECTED
Influenza A: NOT DETECTED
Influenza B: NOT DETECTED
METAPNEUMOVIRUS-RVPPCR: NOT DETECTED
Mycoplasma pneumoniae: NOT DETECTED
PARAINFLUENZA VIRUS 3-RVPPCR: NOT DETECTED
PARAINFLUENZA VIRUS 4-RVPPCR: NOT DETECTED
Parainfluenza Virus 1: NOT DETECTED
Parainfluenza Virus 2: NOT DETECTED
RESPIRATORY SYNCYTIAL VIRUS-RVPPCR: NOT DETECTED
Rhinovirus / Enterovirus: NOT DETECTED

## 2016-05-31 LAB — CBC WITH DIFFERENTIAL/PLATELET
Basophils Absolute: 0 10*3/uL (ref 0.0–0.1)
Basophils Relative: 0 %
Eosinophils Absolute: 0 10*3/uL (ref 0.0–0.7)
Eosinophils Relative: 0 %
HEMATOCRIT: 26.7 % — AB (ref 36.0–46.0)
HEMOGLOBIN: 9 g/dL — AB (ref 12.0–15.0)
LYMPHS ABS: 0.4 10*3/uL — AB (ref 0.7–4.0)
LYMPHS PCT: 11 %
MCH: 25.4 pg — AB (ref 26.0–34.0)
MCHC: 33.7 g/dL (ref 30.0–36.0)
MCV: 75.4 fL — AB (ref 78.0–100.0)
Monocytes Absolute: 0.2 10*3/uL (ref 0.1–1.0)
Monocytes Relative: 5 %
NEUTROS PCT: 84 %
Neutro Abs: 3.3 10*3/uL (ref 1.7–7.7)
Platelets: 75 10*3/uL — ABNORMAL LOW (ref 150–400)
RBC: 3.54 MIL/uL — AB (ref 3.87–5.11)
RDW: 16.4 % — ABNORMAL HIGH (ref 11.5–15.5)
WBC: 3.9 10*3/uL — AB (ref 4.0–10.5)

## 2016-05-31 LAB — COMPREHENSIVE METABOLIC PANEL
ALBUMIN: 2.1 g/dL — AB (ref 3.5–5.0)
ALK PHOS: 459 U/L — AB (ref 38–126)
ALT: 294 U/L — ABNORMAL HIGH (ref 14–54)
AST: 1328 U/L — AB (ref 15–41)
Anion gap: 5 (ref 5–15)
BILIRUBIN TOTAL: 1.8 mg/dL — AB (ref 0.3–1.2)
BUN: 76 mg/dL — AB (ref 6–20)
CALCIUM: 7.3 mg/dL — AB (ref 8.9–10.3)
CO2: 31 mmol/L (ref 22–32)
CREATININE: 1.21 mg/dL — AB (ref 0.44–1.00)
Chloride: 112 mmol/L — ABNORMAL HIGH (ref 101–111)
GFR calc Af Amer: 45 mL/min — ABNORMAL LOW (ref >60)
GFR calc non Af Amer: 39 mL/min — ABNORMAL LOW (ref >60)
GLUCOSE: 169 mg/dL — AB (ref 65–99)
Potassium: 4 mmol/L (ref 3.5–5.1)
Sodium: 148 mmol/L — ABNORMAL HIGH (ref 135–145)
TOTAL PROTEIN: 4.7 g/dL — AB (ref 6.5–8.1)

## 2016-05-31 LAB — ACETAMINOPHEN LEVEL: Acetaminophen (Tylenol), Serum: <10 ug/mL — ABNORMAL LOW (ref 10–30)

## 2016-05-31 LAB — CREATININE, SERUM
Creatinine, Ser: 1.19 mg/dL — ABNORMAL HIGH (ref 0.44–1.00)
GFR calc Af Amer: 46 mL/min — ABNORMAL LOW (ref >60)
GFR calc non Af Amer: 40 mL/min — ABNORMAL LOW (ref >60)

## 2016-05-31 LAB — HIV ANTIBODY (ROUTINE TESTING W REFLEX): HIV Screen 4th Generation wRfx: NONREACTIVE

## 2016-05-31 LAB — STREP PNEUMONIAE URINARY ANTIGEN: Strep Pneumo Urinary Antigen: POSITIVE — AB

## 2016-05-31 LAB — MRSA PCR SCREENING: MRSA by PCR: NEGATIVE

## 2016-05-31 MED ORDER — LORAZEPAM 2 MG/ML PO CONC: 1.0000 mg | ORAL | Status: DC | PRN

## 2016-05-31 MED ORDER — LORAZEPAM 1 MG PO TABS
1.0000 mg | ORAL_TABLET | ORAL | Status: DC | PRN
Start: 2016-05-31 — End: 2016-06-01

## 2016-05-31 MED ORDER — CHLORHEXIDINE GLUCONATE 0.12 % MT SOLN
15.0000 mL | Freq: Two times a day (BID) | OROMUCOSAL | Status: DC
  Administered 2016-05-31 – 2016-06-01 (×3): 15 mL via OROMUCOSAL
  Filled 2016-05-31: qty 15

## 2016-05-31 MED ORDER — MORPHINE SULFATE (CONCENTRATE) 10 MG/0.5ML PO SOLN: 5.0000 mg | ORAL | Status: DC | PRN

## 2016-05-31 MED ORDER — ORAL CARE MOUTH RINSE
15.0000 mL | Freq: Two times a day (BID) | OROMUCOSAL | Status: DC
  Administered 2016-05-31 – 2016-06-01 (×2): 15 mL via OROMUCOSAL

## 2016-05-31 MED ORDER — HYDROMORPHONE HCL 1 MG/ML IJ SOLN
0.5000 mg | Freq: Once | INTRAMUSCULAR | Status: DC
  Filled 2016-05-31: qty 0.5

## 2016-05-31 MED ORDER — LORAZEPAM 2 MG/ML IJ SOLN
1.0000 mg | INTRAMUSCULAR | Status: DC | PRN
  Administered 2016-06-01: 1 mg via INTRAVENOUS
  Filled 2016-05-31: qty 1

## 2016-05-31 NOTE — Consult Note (Signed)
Consultation Note Date: 05/31/2016   Patient Name: Beth Hampton  DOB: [birth year]  MRN: [id number]  Age / Sex: 83 y.o., female  PCP: Patient, No Pcp Per Referring Physician: Albertine GratesXu, Fang, MD  Reason for Consultation: Establishing goals of care  HPI/Patient Profile: 83 y.o. female  with past medical history of dementia admitted on 05/30/2016 with sepsis, PNA.   Clinical Assessment and Goals of Care:  Patient is a 83 yo female, who lives at home with her niece Beth Hampton, who is the patient's only known next of kin. Patient has a history of dementia, was hospitalized in February 2018 for rectal prolapse, seen by surgery, deemed not a surgical candidate, advised good bowel regimen and for rectum to be reduced manually as needed. Patient was also in the hospital in December 2017 with fall, hypothermia and bruises. Patient was admitted overnight for hypothermia, sepsis, PNA. She was found to have LLL PNA, hypernatremia, pancytopenia.   She is currently in the stepdown unit at [facility], on a bair hugger. A palliative consult has been placed for goals of care discussions. Chart review notes that patient was assigned to hospice services in the past, to the best of my knowledge, not currently with hospice services.   Patient is a weak, thin, cachectic appearing elderly lady, she has poor dentition, bruises on both eyelids, contractures on both upper and lower extremities. She is able to moan in response to me calling her name, but is not able to open eyes, is not able to follow commands.   Call placed and discussed with niece Beth Hampton, who states that she is the primary caregiver for the patient, the patient is like a mother to her, Beth Hampton is tearful and states that she recognizes that the patient is critically ill, possibly even at the end of her life, she is agreeable to having more discussions with hospice later  today, we discussed over the phone about comfort measures when the patient is able to go to residential hospice, hopefully soon. Beth Hampton states that the family spent most of the night in the ED, they will arrive at the hospital later this afternoon.   NEXT OF KIN  niece Beth Hampton.   SUMMARY OF RECOMMENDATIONS    Agree with DNR DNI CSW consult for residential hospice Continue current mode of care for now Agree that best course of action to take is for the patient to go to hospice facility for comfort measures.   Code Status/Advance Care Planning:  DNR    Symptom Management:      Palliative Prophylaxis:   Delirium Protocol  Psycho-social/Spiritual:   Desire for further Chaplaincy support:yes  Additional Recommendations: Education on Hospice  Prognosis:   Hours - Days  Discharge Planning: Hospice facility      Primary Diagnoses: Present on Admission: . Hypothermia . Hypernatremia . ARF (acute renal failure) (HCC) . Community acquired pneumonia . Sepsis (HCC) . Abnormal liver function . Dementia   I have reviewed the medical record, interviewed the patient and family, and examined the patient.  The following aspects are pertinent.  Past Medical History:  Diagnosis Date  . Dementia   . Murmur    Social History   Social History  . Marital status: Single    Spouse name: N/A  . Number of children: N/A  . Years of education: N/A   Occupational History  . retired    Social History Main Topics  . Smoking status: Never Smoker  . Smokeless tobacco: Current User    Types: Chew  . Alcohol use No  . Drug use: No  . Sexual activity: Not Asked   Other Topics Concern  . None   Social History Narrative  . None   Family History  Problem Relation Age of Onset  . Family history unknown: Yes   Scheduled Meds: . chlorhexidine  15 mL Mouth Rinse BID  . ciprofloxacin  1 drop Both Eyes Q2H while awake  . heparin  5,000 Units Subcutaneous Q8H  . hydrocerin  1  application Topical BID  .  HYDROmorphone (DILAUDID) injection  0.5 mg Intravenous Once  . mouth rinse  15 mL Mouth Rinse q12n4p   Continuous Infusions: . azithromycin Stopped (05/31/16 0029)  . dextrose 5 % and 0.45% NaCl 100 mL/hr at 05/30/16 2307  . piperacillin-tazobactam (ZOSYN)  IV Stopped (05/31/16 0539)  . vancomycin     PRN Meds:. Medications Prior to Admission:  Prior to Admission medications   Medication Sig Start Date End Date Taking? Authorizing Provider  ciprofloxacin (CILOXAN) 0.3 % ophthalmic solution Place 1 drop into both eyes every 2 (two) hours while awake. For 2 days, then 1 drop every 4 hours while awake for 5 days 04/10/16  Yes [provider]  erythromycin ophthalmic ointment Place 1 application into both eyes 3 (three) times daily. 05/19/16  Yes [provider]  gabapentin (NEURONTIN) 300 MG capsule Take 300 mg by mouth 3 (three) times daily. 04/07/16  Yes [provider]  meloxicam (MOBIC) 7.5 MG tablet Take 7.5 mg by mouth daily. 04/07/16  Yes [provider]  acetaminophen (TYLENOL) 325 MG tablet Take 2 tablets (650 mg total) by mouth every 6 (six) hours as needed for mild pain (or Fever >/= 101). 01/23/16   Vassie Loll, MD  doxycycline (VIBRA-TABS) 100 MG tablet Take 1 tablet (100 mg total) by mouth every 12 (twelve) hours. 01/23/16   Vassie Loll, MD  feeding supplement, ENSURE ENLIVE, (ENSURE ENLIVE) LIQD Take 237 mLs by mouth 2 (two) times daily between meals. 01/23/16   Vassie Loll, MD  hydrocerin (EUCERIN) CREA Apply 1 application topically 2 (two) times daily. Apply to skin to maintain good moisturize level 01/23/16   Vassie Loll, MD   No Known Allergies Review of Systems Non verbal  Physical Exam Thin frail weak elderly Poor dentition Bruises  Shallow clear breathing S1 S2 Has murmur Abdomen soft, thin Has bilateral 2+ edema, extremities are warm to touch, patient is on bair hugger No coolness no  mottling detected by me Arouses some, but is not awake, not alert  Vital Signs: BP 133/71   Pulse 63   Temp 97.5 F (36.4 C) (Oral)   Resp 12   Ht 5' (1.524 m)   Wt 39.4 kg (86 lb 13.8 oz)   SpO2 100%   BMI 16.96 kg/m  Pain Assessment: CPOT       SpO2: SpO2: 100 % O2 Device:SpO2: 100 % O2 Flow Rate: .O2 Flow Rate (L/min): 2 L/min  IO: Intake/output summary:  Intake/Output Summary (Last 24 hours)  at 05/31/16 1610 Last data filed at 05/31/16 0600  Gross per 24 hour  Intake          2297.33 ml  Output              750 ml  Net          1547.33 ml    LBM:   Baseline Weight: Weight: 35.8 kg (79 lb) Most recent weight: Weight: 39.4 kg (86 lb 13.8 oz)     Palliative Assessment/Data:   Flowsheet Rows     Most Recent Value  Intake Tab  Referral Department  Hospitalist  Unit at Time of Referral  Intermediate Care Unit  Palliative Care Primary Diagnosis  Sepsis/Infectious Disease  Palliative Care Type  New Palliative care  Reason for referral  Clarify Goals of Care  Date first seen by Palliative Care  05/31/16  Clinical Assessment  Palliative Performance Scale Score  20%  Pain Max last 24 hours  5  Pain Min Last 24 hours  3  Dyspnea Max Last 24 Hours  4  Dyspnea Min Last 24 hours  3  Nausea Max Last 24 Hours  0  Nausea Min Last 24 Hours  0  Anxiety Max Last 24 Hours  0  Anxiety Min Last 24 Hours  0  Psychosocial & Spiritual Assessment  Palliative Care Outcomes  Patient/Family meeting held?  Yes  Who was at the meeting?  Patient's niece her primary caregiver   Palliative Care Outcomes  Clarified goals of care      Time In:  8.20 Time Out:9.30   Time Total:  70 min  Greater than 50%  of this time was spent counseling and coordinating care related to the above assessment and plan.  Signed by: Rosalin Hawking, MD  [phone]  Please contact Palliative Medicine Team phone at [phone] for questions and concerns.  For individual provider: See  Loretha Stapler

## 2016-05-31 NOTE — Progress Notes (Signed)
[facility]  Received a call from [name], CSW of [facility].  Per request, spoke to great niece, [name] who confirmed interest.  Answered questions.  Unfortunately, there is no bed availability today.  [name] who will be available to complete paperwork for transport if bed becomes available.  Her number is [phone].  Updated CSW, [name].  Left contact information with [name] if any questions arise.  Please feel free to contact with any questions.  Thank you, [name] RN, [facility] [phone]

## 2016-05-31 NOTE — Progress Notes (Signed)
Hypoglycemic Event  CBG: 64  Treatment: D50 IV 25 mL  Symptoms: None  Follow-up CBG: Time:2330 CBG Result:176  Possible Reasons for Event: Inadequate meal intake  Comments/MD notified:    Sabino NiemannSamantha Doris Mcgilvery

## 2016-05-31 NOTE — Social Work (Signed)
CSW was consulted for hospice needs.  CSW spoke to [phone], great niece, and they indicated that [facility]. Family was amenable to consider other hosp[facility] placements [facility].  CSW contacted Misty StanleyStacey of [facility] and left msg for Hospice RN to reach out to family and assess.

## 2016-05-31 NOTE — Progress Notes (Signed)
PROGRESS NOTE  Beth Hampton VEL:[id number] DOB: 01/03/1927 DOA: 05/30/2016 PCP: Patient, No Pcp Per  Brief summary:   Patient has advanced dementia, was on hospice before, Pt brought in from home Per EMS family states pt is more lethargic than usual. Pt failure to thrive. Family states they cannot get her to eat and unsure  Last time she has eaten. Pt hx of dementia. Pt has had 350 mL NS and D10 in route as pts initial CBG was 49.   In Ed, pt noted to be hypothermic. T 92.9,  Pt pancytopenic  Wbc 3.5, Hgb 10.8, Plt 84, and hypernatremic with Na=151, and mild ARF w bun/creatinine 71/1.5    CXR showed left lower lung  opacity.  CT scan abdomen/pelvis =>  Small bilateral pleural effusion, w mild bibasilar opacity ? Atelectasis vs pneumonia.  Pt will be admitted for sepsis, ? Secondary to pneumonia. Pt also has dusky toes likely due to hypoperfusion  HPI/Recap of past 24 hours:  Patient is not responsive,   Assessment/Plan: Principal Problem:   Sepsis ([place]) Active Problems:   Hypothermia   Dementia   Hypernatremia   ARF (acute renal failure) ([place])   Community acquired pneumonia   Abnormal liver function   Failure to thrive in adult   Encounter for palliative care   Goals of care, counseling/discussion   Sepsis with, pneumonia, metabolic encephalopathy,  hypothermia, wbc 3.5 she is started on broad spectrum abx with vanc/zozyn/zitrhomax and admitted to stepdown unit  Elevation of lft; ast 1328, alt 294, alk phos 459  CT ab no liver or gallbladder abnormalities. Hepatitis panel pending, ck level was not checked,  Sepsis could also cause lft elevation  Pancytopenia: possible multifactorial from sepsis, malnutrition.  AKI on ckd III: cr peaked at 1.5 ( baseline 1.04 in 12/2015), ua no infection.  Hypoglycemia: likely from poor oral intake  initial CBG was 49. Pt has had 350 mL NS and D10 in route by EMS, then d50 in the ED. She is stared on d5/hald ns since  admission  Hypernatremia: likely from poor oral intake  Family states they cannot get her to eat and unsure  Last time she has eaten.   End stage dementia/severe malnutrition Body mass index is 16.96 kg/m. Diffuse subcutaneous and mesenteric edema with paucity of fat,  Palliative care consulted, family agreeable to transition to comfort measures and residential hospice, social worker and hospice notified.   Code Status: DNR  Family Communication: multiple family at bedside  Disposition Plan: residential hospice   Consultants:  Palliative care consulted  Procedures:  none  Antibiotics:  vanc/zosyn/zithro   Objective: BP (!) 142/76   Pulse 70   Temp 97.5 F (36.4 C) (Oral)   Resp 13   Ht 5' (1.524 m)   Wt 39.4 kg (86 lb 13.8 oz)   SpO2 100%   BMI 16.96 kg/m   Intake/Output Summary (Last 24 hours) at 05/31/16 1814 Last data filed at 05/31/16 1400  Gross per 24 hour  Intake          1688.33 ml  Output              750 ml  Net           938.33 ml   Filed Weights   05/30/16 1740 05/30/16 2100  Weight: 35.8 kg (79 lb) 39.4 kg (86 lb 13.8 oz)    Exam:   General:  Not responsive, not open eyes to voice, very cachectic , dry oral  mucosa, poor dentition   Cardiovascular: RRR  Respiratory: CTABL  Abdomen: Soft/ND/NT, positive BS  Musculoskeletal: edema and erythematous  Neuro: Not responsive, not open eyes to voice  Data Reviewed: Basic Metabolic Panel:  Recent Labs Lab 05/30/16 1611 05/30/16 1621 05/30/16 2333 05/31/16 0047  NA 151* 148*  --  148*  K 3.9 3.7  --  4.0  CL 113* 111  --  112*  CO2 30  --   --  31  GLUCOSE 114* 111*  --  169*  BUN 78* 71*  --  76*  CREATININE 1.18* 1.50* 1.19* 1.21*  CALCIUM 7.4*  --   --  7.3*   Liver Function Tests:  Recent Labs Lab 05/30/16 1611 05/31/16 0047  AST 463* 1,328*  ALT 169* 294*  ALKPHOS 295* 459*  BILITOT 1.3* 1.8*  PROT 5.3* 4.7*  ALBUMIN 2.4* 2.1*   No results for input(s): LIPASE,  AMYLASE in the last 168 hours.  Recent Labs Lab 05/30/16 2045  AMMONIA 30   CBC:  Recent Labs Lab 05/30/16 1611 05/30/16 1621 05/31/16 0047  WBC 3.5*  --  3.9*  NEUTROABS 2.9  --  3.3  HGB 10.8* 12.2 9.0*  HCT 31.8* 36.0 26.7*  MCV 75.9*  --  75.4*  PLT 84*  --  75*   Cardiac Enzymes:   No results for input(s): CKTOTAL, CKMB, CKMBINDEX, TROPONINI in the last 168 hours. BNP (last 3 results) No results for input(s): BNP in the last 8760 hours.  ProBNP (last 3 results) No results for input(s): PROBNP in the last 8760 hours.  CBG:  Recent Labs Lab 05/30/16 1556 05/30/16 2326  GLUCAP 140* 176*    Recent Results (from the past 240 hour(s))  Blood culture (routine x 2)     Status: None (Preliminary result)   Collection Time: 05/30/16  4:11 PM  Result Value Ref Range Status   Specimen Description BLOOD BLOOD RIGHT FOREARM  Final   Special Requests IN PEDIATRIC BOTTLE Blood Culture adequate volume  Final   Culture   Final    NO GROWTH < 24 HOURS Performed at [facility], [place] [address], [place], [place] [address]    Report Status PENDING  Incomplete  Blood culture (routine x 2)     Status: None (Preliminary result)   Collection Time: 05/30/16  4:59 PM  Result Value Ref Range Status   Specimen Description BLOOD RIGHT ANTECUBITAL  Final   Special Requests   Final    IN PEDIATRIC BOTTLE Blood Culture adequate volume Performed at [place] [facility] [address] [place], [place] [address]    Culture PENDING  Incomplete   Report Status PENDING  Incomplete  Respiratory Panel by PCR     Status: None   Collection Time: 05/30/16  9:31 PM  Result Value Ref Range Status   Adenovirus NOT DETECTED NOT DETECTED Final   Coronavirus 229E NOT DETECTED NOT DETECTED Final   Coronavirus HKU1 NOT DETECTED NOT DETECTED Final   Coronavirus NL63 NOT DETECTED NOT DETECTED Final   Coronavirus OC43 NOT DETECTED NOT DETECTED Final   Metapneumovirus NOT DETECTED NOT DETECTED  Final   Rhinovirus / Enterovirus NOT DETECTED NOT DETECTED Final   Influenza A NOT DETECTED NOT DETECTED Final   Influenza B NOT DETECTED NOT DETECTED Final   Parainfluenza Virus 1 NOT DETECTED NOT DETECTED Final   Parainfluenza Virus 2 NOT DETECTED NOT DETECTED Final   Parainfluenza Virus 3 NOT DETECTED NOT DETECTED Final  Parainfluenza Virus 4 NOT DETECTED NOT DETECTED Final   Respiratory Syncytial Virus NOT DETECTED NOT DETECTED Final   Bordetella pertussis NOT DETECTED NOT DETECTED Final   Chlamydophila pneumoniae NOT DETECTED NOT DETECTED Final   Mycoplasma pneumoniae NOT DETECTED NOT DETECTED Final    Comment: Performed at [facility], [place] [address] [place], [place] [address]  MRSA PCR Screening     Status: None   Collection Time: 05/30/16  9:31 PM  Result Value Ref Range Status   MRSA by PCR NEGATIVE NEGATIVE Final    Comment:        The GeneXpert MRSA Assay (FDA approved for NASAL specimens only), is one component of a comprehensive MRSA colonization surveillance program. It is not intended to diagnose MRSA infection nor to guide or monitor treatment for MRSA infections.      Studies: Ct Abdomen Pelvis W Contrast  Result Date: 05/30/2016 CLINICAL DATA:  83 year old female with abdominal pain, sepsis and lethargy. History of dementia. EXAM: CT ABDOMEN AND PELVIS WITH CONTRAST TECHNIQUE: Multidetector CT imaging of the abdomen and pelvis was performed using the standard protocol following bolus administration of intravenous contrast. CONTRAST:  51m ISOVUE-300 IOPAMIDOL (ISOVUE-300) INJECTION 61% COMPARISON:  None. FINDINGS: Sensitivity is decreased due to paucity of fat and mild diffuse edema. Lower chest: Cardiomegaly and small bilateral pleural effusions are noted. Mild bibasilar opacities are ill-defined and may represent atelectasis versus airspace disease/ pneumonia. Hepatobiliary: No definite hepatic abnormality. The gallbladder is difficult to identified.  Pancreas: No definite abnormality Spleen: Unremarkable Adrenals/Urinary Tract: Bladder distention noted. Bilateral renal cortical atrophy identified. No definite adrenal abnormality. Stomach/Bowel: No definite abnormality. Vascular/Lymphatic: Aortic atherosclerotic calcifications noted without aneurysm. No definite enlarged lymph nodes. Reproductive: No definite abnormality Other: Diffuse subcutaneous and mesenteric edema noted. No focal collection identified. Musculoskeletal: No acute abnormality or suspicious focal lesion. Multilevel degenerative changes within the lumbar spine noted. IMPRESSION: Diffuse subcutaneous and mesenteric edema with paucity of fat, limiting evaluation. No other acute abnormality identified within the abdomen/pelvis. Small bilateral pleural effusions with mild bibasilar opacities -question atelectasis versus airspace disease/ pneumonia. Bladder distension. Abdominal aortic atherosclerosis. Cardiomegaly. Electronically Signed   By: JMargarette CanadaM.D.   On: 05/30/2016 19:36    Scheduled Meds: . chlorhexidine  15 mL Mouth Rinse BID  . ciprofloxacin  1 drop Both Eyes Q2H while awake  . hydrocerin  1 application Topical BID  .  HYDROmorphone (DILAUDID) injection  0.5 mg Intravenous Once  . mouth rinse  15 mL Mouth Rinse q12n4p    Continuous Infusions: . dextrose 5 % and 0.45% NaCl 100 mL/hr at 05/31/16 1400     Time spent: 333ms  Janzen Sacks MD, PhD  [facility] Pager [phone] 7PM-7AM, please contact night-coverage at [email or web address], password [email or web address]/06/2016, 6:14 PM  LOS: 1 day

## 2016-05-31 NOTE — Progress Notes (Signed)
Notified MD of rectal temp of 92.3. Pt still on bair hugger and has multiple warm blankets on top of her. Will continue to monitor.

## 2016-06-01 LAB — LEGIONELLA PNEUMOPHILA SEROGP 1 UR AG: L. PNEUMOPHILA SEROGP 1 UR AG: NEGATIVE

## 2016-06-01 LAB — HEPATITIS PANEL, ACUTE
HCV Ab: <0.1 s/co ratio (ref 0.0–0.9)
HEP B C IGM: NEGATIVE
Hep A IgM: NEGATIVE
Hepatitis B Surface Ag: NEGATIVE

## 2016-06-01 LAB — GLUCOSE, CAPILLARY: Glucose-Capillary: 62 mg/dL — ABNORMAL LOW (ref 65–99)

## 2016-06-01 MED ORDER — MORPHINE SULFATE (CONCENTRATE) 10 MG/0.5ML PO SOLN: 5.0000 mg | ORAL | 0 refills | Status: AC | PRN

## 2016-06-01 MED ORDER — LORAZEPAM 2 MG/ML PO CONC: 1.0000 mg | ORAL | 0 refills | Status: AC | PRN

## 2016-06-01 MED ORDER — CHLORHEXIDINE GLUCONATE 0.12 % MT SOLN: 15.0000 mL | Freq: Two times a day (BID) | OROMUCOSAL | 0 refills | Status: AC

## 2016-06-01 MED ORDER — ORAL CARE MOUTH RINSE: 15.0000 mL | Freq: Two times a day (BID) | OROMUCOSAL | 0 refills | Status: AC

## 2016-06-01 NOTE — Progress Notes (Signed)
[facility]. Family is in agreement with this plan. [facility] transport is required. Medical necessity form completed. D/C Summary sent to facility for review. # for report provided to nsg.   Cori RazorJamie Priya Matsen LCSW [phone]

## 2016-06-01 NOTE — Progress Notes (Signed)
No charge note  Patient appears comfortable, resting in bed, does not arouse to gentle voice command Chart reviewed [facility] Prognosis hours to days No family at bedside  Rosalin HawkingZeba Nayab Aten MD [facility] palliative [facility] team [phone]

## 2016-06-01 NOTE — Progress Notes (Signed)
[facility] Liaison RN visit  Reconfirmed request for family interest in [place] from [place] for transfer for today.  [facility] bed available and Hospice eligibility confirmed.  Chart reviewed and received report from bedside RN.  Met with great niece Sunday Corn and daughter Marlowe Kays to confirm interest and explain services.  Family agreeable to transfer today.  Registration paper work completed.  Dr. Orpah Melter to assume care per family request. Please fax discharge summary to [phone].  RN please call report to [phone] and d/c PIV before transfer to [facility].  Please arrange transport for patient to arrive asap if possible.   Thank you,   Gar Ponto, [place] [facility] Liaison [phone]

## 2016-06-01 NOTE — Progress Notes (Signed)
Nutrition Brief Note  Pt screened for MST. Chart reviewed. Pt being followed by Palliative Care and is now transitioning to comfort care with prognosis of hours to days. [facility]  No nutrition interventions warranted at this time.  Please re-consult as needed.     Trenton GammonJessica Ripken Rekowski, MS, RD, LDN, [facility] Inpatient Clinical Dietitian Pager # [phone] After hours/weekend pager # [phone]

## 2016-06-01 NOTE — Care Management Note (Signed)
 Case Management Note  Patient Details  Name: Beth Hampton MRN: [id number] Date of Birth: [birth year]  Subjective/Objective:   In Ed, pt noted to be hypothermic. T 92.9,  Pt pancytopenic  Wbc 3.5, Hgb 10.8, Plt 84, and hypernatremic with Na=151, and mild ARF w bun/creatinine 71/1.5    CXR showed left lower lung  opacity.  CT scan abdomen/pelvis =>  Small bilateral pleural effusion, w mild bibasilar opacity ? Atelectasis vs pneumonia.  Pt will be admitted for sepsis, ? Secondary to pneumonia. Pt also has dusky toes likely due to hypoperfusion                  Action/Plan: discharged to medical hospice facility for full comfort care.   Expected Discharge Date:                 Expected Discharge Plan:  Hospice Medical Facility  In-House Referral:  Clinical Social Work  Discharge planning Services  CM Consult  Post Acute Care Choice:    Choice offered to:     DME Arranged:    DME Agency:     HH Arranged:    HH Agency:     Status of Service:  Completed, signed off  If discussed at [employer] Meetings, dates discussed:    Additional Comments:  Golda AcreDavis, Rhonda Lynn, RN , 9:57 AM

## 2016-06-01 NOTE — Progress Notes (Signed)
CSW received call from Forrestine HimEva Davis, LCS[facility] liaison [facility], reporting that there is an opening for pt today. CSW will assist with d/c planning.  Cori RazorJamie Eulla Kochanowski LCSW [phone]

## 2016-06-01 NOTE — Discharge Summary (Signed)
 Discharge Summary  Beth Hampton AVW:[id number] DOB: 1926-11-12  PCP: Patient, No Pcp Per  Admit date: 05/30/2016 Discharge date:   Time spent: <29mns  Discharge to residential hospice on full comfort measures  Discharge Diagnoses:  Active Hospital Problems   Diagnosis Date Noted  . Sepsis ([place] 05/30/2016  . Failure to thrive in adult   . Encounter for palliative care   . Goals of care, counseling/discussion   . Hypernatremia 05/30/2016  . ARF (acute renal failure) ([place] 05/30/2016  . Community acquired pneumonia 05/30/2016  . Abnormal liver function 05/30/2016  . Hypothermia 01/23/2016  . Dementia 01/23/2016    Resolved Hospital Problems   Diagnosis Date Noted Date Resolved  No resolved problems to display.    Discharge Condition: vital stable, minimal responsive   Diet recommendation:   Filed Weights   05/30/16 1740 05/30/16 2100  Weight: 35.8 kg (79 lb) 39.4 kg (86 lb 13.8 oz)    History of present illness:   Patient has advanced dementia, was on hospice before, Pt brought in from home Per EMS family states pt is more lethargic than usual. Pt failure to thrive. Family states they cannot get her to eat and unsure Last time she has eaten. Pt hx of dementia. Pt has had 350 mL NS and D10 in route as pts initial CBG was 49.   Beth Hampton is a 83y.o. female, w Dementia who is unable to provide any meaningful history.  Per her Niece she was lethargic today, and generally weak and not eating well and therefore she was brought to ED by her family.    In Ed, pt noted to be hypothermic. T 92.9,  Pt pancytopenic  Wbc 3.5, Hgb 10.8, Plt 84, and hypernatremic with Na=151, and mild ARF w bun/creatinine 71/1.5    CXR showed left lower lung  opacity.  CT scan abdomen/pelvis =>  Small bilateral pleural effusion, w mild bibasilar opacity ? Atelectasis vs pneumonia.  Pt will be admitted for sepsis, ? Secondary to pneumonia. Pt also has dusky toes likely due  to hypoperfusion  I had long discussion with Niece about poor prognosis and she agrees w  Code Status DNR  Hospital Course:  Principal Problem:   Sepsis ([place] Active Problems:   Hypothermia   Dementia   Hypernatremia   AR[place] (acute renal failure) ([place]   Community acquired pneumonia   Abnormal liver function   Failure to thrive in adult   Encounter for palliative care   Goals of care, counseling/discussion    Sepsis with, pneumonia, metabolic encephalopathy,  hypothermia, wbc 3.5 she is started on broad spectrum abx with vanc/zozyn/zitrhomax and admitted to stepdown unit Discharge to residential hospice on full comfort measures  Elevation of lft; ast 1328, alt 294, alk phos 459  CT ab no liver or gallbladder abnormalities. Hepatitis panel pending, ck level was not checked,  Sepsis could also cause lft elevation Discharge to residential hospice on full comfort measures  Pancytopenia: possible multifactorial from sepsis, malnutrition. Discharge to residential hospice on full comfort measures  AKI on ckd III: cr peaked at 1.5 ( baseline 1.04 in 12/2015), ua no infection. Discharge to residential hospice on full comfort measures  Hypoglycemia: likely from poor oral intake  initial CBG was 49. Pt has had 350 mL NS and D10 in route by EMS, then d50 in the ED. She is received d5/hald ns since admission Discharge to residential hospice on full comfort measures  Hypernatremia: likely from poor oral intake  Family  states they cannot get her to eat and unsure Last time she has eaten.  Discharge to residential hospice on full comfort measures  End stage dementia/severe malnutrition Body mass index is 16.96 kg/m. Diffuse subcutaneous and mesenteric edema with paucity of fat,  Palliative care consulted, family agreeable to transition to comfort measures and residential hospice, social worker and hospice notified.   Code Status: DNR  Family Communication: multiple family  at bedside  Disposition Plan: residential hospice   Consultants:  Palliative care consulted  Procedures:  none  Antibiotics:  vanc/zosyn/zithro   Discharge Exam: BP 139/79   Pulse 71   Temp 97.3 F (36.3 C) (Axillary)   Resp 11   Ht 5' (1.524 m)   Wt 39.4 kg (86 lb 13.8 oz)   SpO2 (!) 86%   BMI 16.96 kg/m    General:  Not responsive, not open eyes to voice, very cachectic , dry oral mucosa, poor dentition   Cardiovascular: RRR  Respiratory: diminished   Abdomen: Soft/ND/NT, positive BS  Musculoskeletal: edema and erythematous  Neuro: Not responsive, not open eyes to voice   Discharge Instructions You were cared for by a hospitalist during your hospital stay. If you have any questions about your discharge medications or the care you received while you were in the hospital after you are discharged, you can call the unit and asked to speak with the hospitalist on call if the hospitalist that took care of you is not available. Once you are discharged, your primary care physician will handle any further medical issues. Please note that NO REFILLS for any discharge medications will be authorized once you are discharged, as it is imperative that you return to your primary care physician (or establish a relationship with a primary care physician if you do not have one) for your aftercare needs so that they can reassess your need for medications and monitor your lab values.  Discharge Instructions    Diet general    Complete by:  As directed    Comfort feeds     Allergies as of    No Known Allergies     Medication List    STOP taking these medications   feeding supplement (ENSURE ENLIVE) Liqd   gabapentin 300 MG capsule Commonly known as:  NEURONTIN   meloxicam 7.5 MG tablet Commonly known as:  MOBIC     TAKE these medications   chlorhexidine 0.12 % solution Commonly known as:  PERIDEX 15 mLs by Mouth Rinse route 2 (two) times daily.     ciprofloxacin 0.3 % ophthalmic solution Commonly known as:  CILOXAN Place 1 drop into both eyes every 2 (two) hours while awake. For 2 days, then 1 drop every 4 hours while awake for 5 days   erythromycin ophthalmic ointment Place 1 application into both eyes 3 (three) times daily.   hydrocerin Crea Apply 1 application topically 2 (two) times daily. Apply to skin to maintain good moisturize level   LORazepam 2 MG/ML concentrated solution Commonly known as:  ATIVAN Place 0.5 mLs (1 mg total) under the tongue every 4 (four) hours as needed for anxiety.   morphine CONCENTRATE 10 MG/0.5ML Soln concentrated solution Place 0.25 mLs (5 mg total) under the tongue every 2 (two) hours as needed for moderate pain (or dyspnea).   mouth rinse Liqd solution 15 mLs by Mouth Rinse route 2 times daily at 12 noon and 4 pm.      No Known Allergies    The results of  significant diagnostics from this hospitalization (including imaging, microbiology, ancillary and laboratory) are listed below for reference.    Significant Diagnostic Studies: Dg Chest 2 View  Result Date: 05/30/2016 CLINICAL DATA:  Failure to thrive.  Hypothermia EXAM: CHEST  2 VIEW COMPARISON:  01/22/2016 FINDINGS: Asymmetric airspace disease on the left, presumably pneumonia. No definitive volume loss when accounting for rotation. No effusion or edema. Chronic cardiomegaly. Remote posterior right rib fractures based on prior. No acute osseous finding. IMPRESSION: Asymmetric opacity at the left base favoring pneumonia. Electronically Signed   By: Monte Fantasia M.D.   On: 05/30/2016 17:22   Ct Abdomen Pelvis W Contrast  Result Date: 05/30/2016 CLINICAL DATA:  83 year old female with abdominal pain, sepsis and lethargy. History of dementia. EXAM: CT ABDOMEN AND PELVIS WITH CONTRAST TECHNIQUE: Multidetector CT imaging of the abdomen and pelvis was performed using the standard protocol following bolus administration of intravenous  contrast. CONTRAST:  74m ISOVUE-300 IOPAMIDOL (ISOVUE-300) INJECTION 61% COMPARISON:  None. FINDINGS: Sensitivity is decreased due to paucity of fat and mild diffuse edema. Lower chest: Cardiomegaly and small bilateral pleural effusions are noted. Mild bibasilar opacities are ill-defined and may represent atelectasis versus airspace disease/ pneumonia. Hepatobiliary: No definite hepatic abnormality. The gallbladder is difficult to identified. Pancreas: No definite abnormality Spleen: Unremarkable Adrenals/Urinary Tract: Bladder distention noted. Bilateral renal cortical atrophy identified. No definite adrenal abnormality. Stomach/Bowel: No definite abnormality. Vascular/Lymphatic: Aortic atherosclerotic calcifications noted without aneurysm. No definite enlarged lymph nodes. Reproductive: No definite abnormality Other: Diffuse subcutaneous and mesenteric edema noted. No focal collection identified. Musculoskeletal: No acute abnormality or suspicious focal lesion. Multilevel degenerative changes within the lumbar spine noted. IMPRESSION: Diffuse subcutaneous and mesenteric edema with paucity of fat, limiting evaluation. No other acute abnormality identified within the abdomen/pelvis. Small bilateral pleural effusions with mild bibasilar opacities -question atelectasis versus airspace disease/ pneumonia. Bladder distension. Abdominal aortic atherosclerosis. Cardiomegaly. Electronically Signed   By: JMargarette CanadaM.D.   On: 05/30/2016 19:36    Microbiology: Recent Results (from the past 240 hour(s))  Blood culture (routine x 2)     Status: None (Preliminary result)   Collection Time: 05/30/16  4:11 PM  Result Value Ref Range Status   Specimen Description BLOOD BLOOD RIGHT FOREARM  Final   Special Requests IN PEDIATRIC BOTTLE Blood Culture adequate volume  Final   Culture   Final    NO GROWTH < 24 HOURS Performed at [facility] [id number] [place] [address]   Report Status PENDING   Incomplete  Blood culture (routine x 2)     Status: None (Preliminary result)   Collection Time: 05/30/16  4:59 PM  Result Value Ref Range Status   Specimen Description BLOOD RIGHT ANTECUBITAL  Final   Special Requests   Final    IN PEDIATRIC BOTTLE Blood Culture adequate volume Performed at [facility] [place] [address]   Culture PENDING  Incomplete   Report Status PENDING  Incomplete  Respiratory Panel by PCR     Status: None   Collection Time: 05/30/16  9:31 PM  Result Value Ref Range Status   Adenovirus NOT DETECTED NOT DETECTED Final   Coronavirus 229E NOT DETECTED NOT DETECTED Final   Coronavirus HKU1 NOT DETECTED NOT DETECTED Final   Coronavirus NL63 NOT DETECTED NOT DETECTED Final   Coronavirus OC43 NOT DETECTED NOT DETECTED Final   Metapneumovirus NOT DETECTED NOT DETECTED Final   Rhinovirus / Enterovirus NOT DETECTED  NOT DETECTED Final   Influenza A NOT DETECTED NOT DETECTED Final   Influenza B NOT DETECTED NOT DETECTED Final   Parainfluenza Virus 1 NOT DETECTED NOT DETECTED Final   Parainfluenza Virus 2 NOT DETECTED NOT DETECTED Final   Parainfluenza Virus 3 NOT DETECTED NOT DETECTED Final   Parainfluenza Virus 4 NOT DETECTED NOT DETECTED Final   Respiratory Syncytial Virus NOT DETECTED NOT DETECTED Final   Bordetella pertussis NOT DETECTED NOT DETECTED Final   Chlamydophila pneumoniae NOT DETECTED NOT DETECTED Final   Mycoplasma pneumoniae NOT DETECTED NOT DETECTED Final    Comment: Performed at [facility], [place] [address], [place], [place] [address]  MRSA PCR Screening     Status: None   Collection Time: 05/30/16  9:31 PM  Result Value Ref Range Status   MRSA by PCR NEGATIVE NEGATIVE Final    Comment:        The GeneXpert MRSA Assay (FDA approved for NASAL specimens only), is one component of a comprehensive MRSA colonization surveillance program. It is not intended to diagnose MRSA infection nor to guide or monitor  treatment for MRSA infections.      Labs: Basic Metabolic Panel:  Recent Labs Lab 05/30/16 1611 05/30/16 1621 05/30/16 2333 05/31/16 0047  NA 151* 148*  --  148*  K 3.9 3.7  --  4.0  CL 113* 111  --  112*  CO2 30  --   --  31  GLUCOSE 114* 111*  --  169*  BUN 78* 71*  --  76*  CREATININE 1.18* 1.50* 1.19* 1.21*  CALCIUM 7.4*  --   --  7.3*   Liver Function Tests:  Recent Labs Lab 05/30/16 1611 05/31/16 0047  AST 463* 1,328*  ALT 169* 294*  ALKPHOS 295* 459*  BILITOT 1.3* 1.8*  PROT 5.3* 4.7*  ALBUMIN 2.4* 2.1*   No results for input(s): LIPASE, AMYLASE in the last 168 hours.  Recent Labs Lab 05/30/16 2045  AMMONIA 30   CBC:  Recent Labs Lab 05/30/16 1611 05/30/16 1621 05/31/16 0047  WBC 3.5*  --  3.9*  NEUTROABS 2.9  --  3.3  HGB 10.8* 12.2 9.0*  HCT 31.8* 36.0 26.7*  MCV 75.9*  --  75.4*  PLT 84*  --  75*   Cardiac Enzymes: No results for input(s): CKTOTAL, CKMB, CKMBINDEX, TROPONINI in the last 168 hours. BNP: BNP (last 3 results) No results for input(s): BNP in the last 8760 hours.  ProBNP (last 3 results) No results for input(s): PROBNP in the last 8760 hours.  CBG:  Recent Labs Lab 05/30/16 1556 05/30/16 2326  GLUCAP 140* 176*       Signed:  Justyn Boyson MD, PhD  [facility] , 9:01 AM

## 2016-06-04 LAB — CULTURE, BLOOD (ROUTINE X 2)
Culture: NO GROWTH
SPECIAL REQUESTS: ADEQUATE

## 2016-06-05 LAB — CULTURE, BLOOD (ROUTINE X 2)
Culture: NO GROWTH
Special Requests: ADEQUATE

## 2016-06-26 DEATH — deceased

## 2017-07-15 IMAGING — CT CT CERVICAL SPINE W/O CM
4 of 7 series · 14 of 34 positions shown, 16 images · non-contrast
Comparison: CT head 03/04/2009.

CLINICAL DATA: Under witnessed fall earlier today. Failure to
parotid with decreased mental status. Right periorbital bruising.

EXAM:
CT HEAD WITHOUT CONTRAST
CT CERVICAL SPINE WITHOUT CONTRAST
TECHNIQUE: Multidetector CT imaging of the head and cervical spine was
performed following the standard protocol without intravenous
contrast. Multiplanar CT image reconstructions of the cervical spine
were also generated.

[Series 11: bone window · axial · 0.37mm/px · z∈[-270,-194]mm · 5 of 58 slices shown, 7 images]
[im 10/58  soft-tissue]
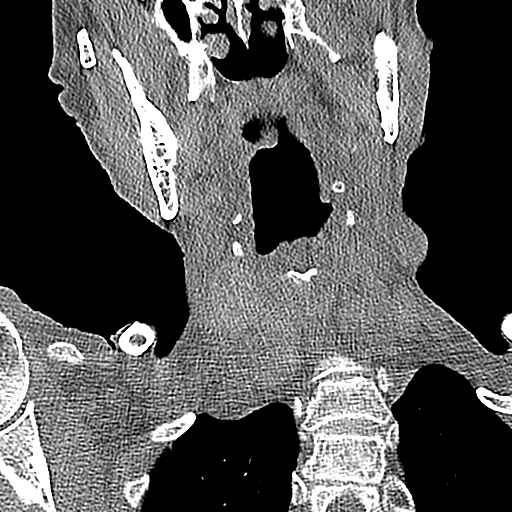
[im 10/58  bone]
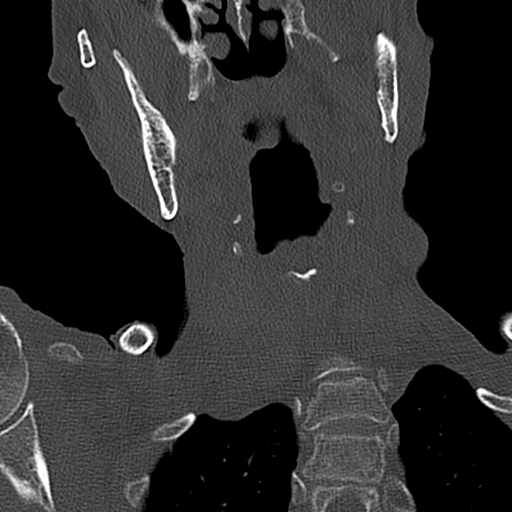
[im 20/58  bone]
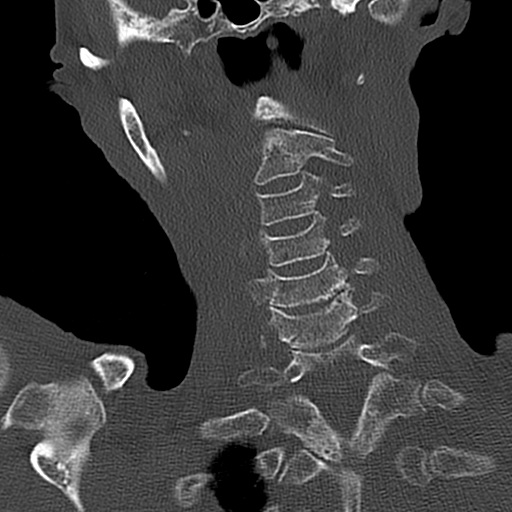
[im 29/58  bone]
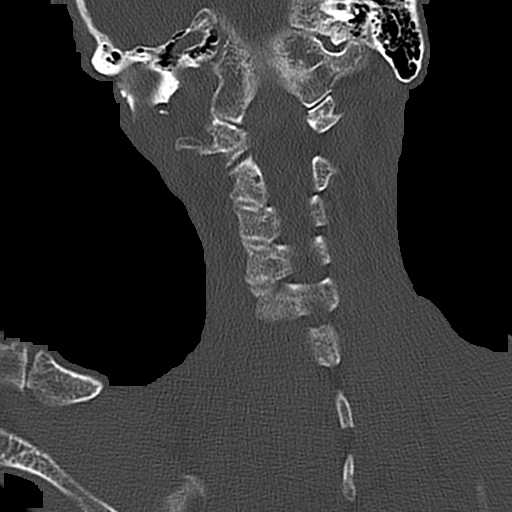
[im 39/58  bone]
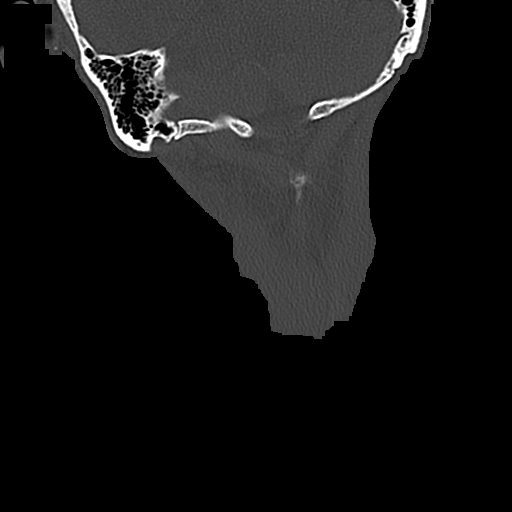
[im 48/58  soft-tissue]
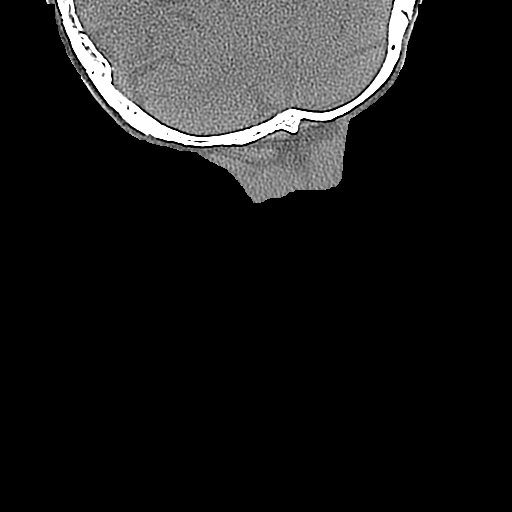
[im 48/58  bone]
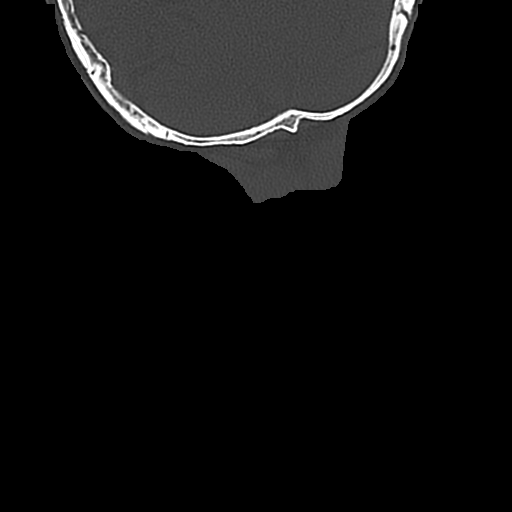

[Series 613: <mpr thick range(8)> · sagittal · 0.35mm/px · 3 of 51 slices shown]
[im 13/51  bone]
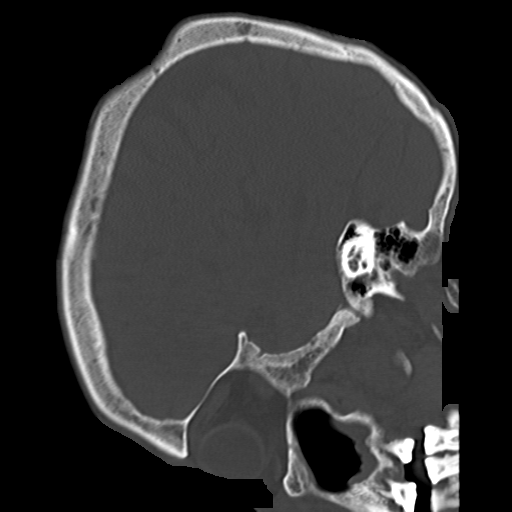
[im 26/51  bone]
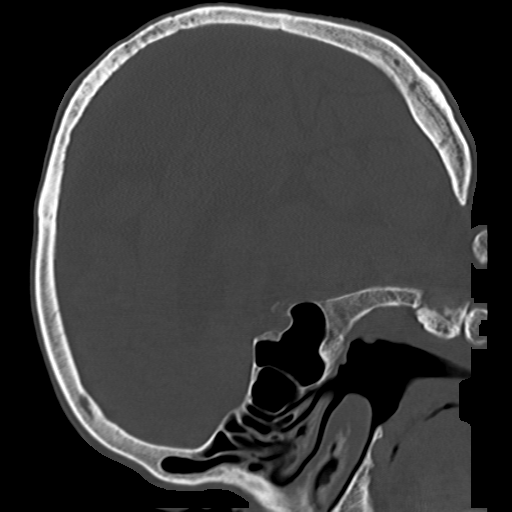
[im 38/51  bone]
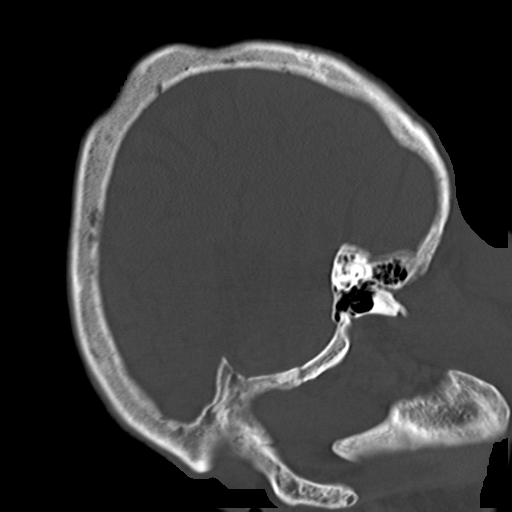

[Series 614: <mpr thick range(9)> · coronal · 0.37mm/px · 1 of 87 slices shown]
[im 44/87  bone]
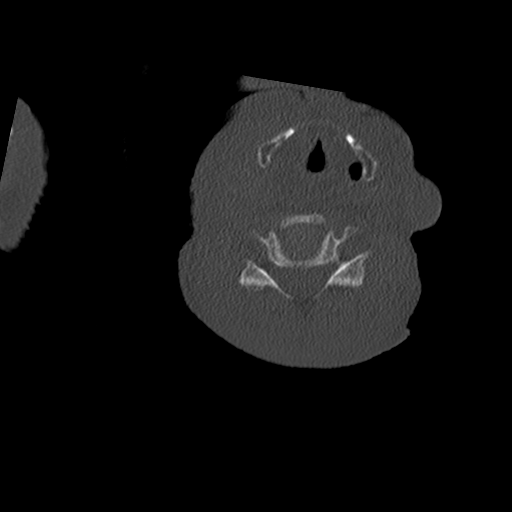

[Series 615: <mpr thick range(10)> · axial · 0.37mm/px · z∈[-248,-178]mm · 5 of 56 slices shown]
[im 10/56  bone]
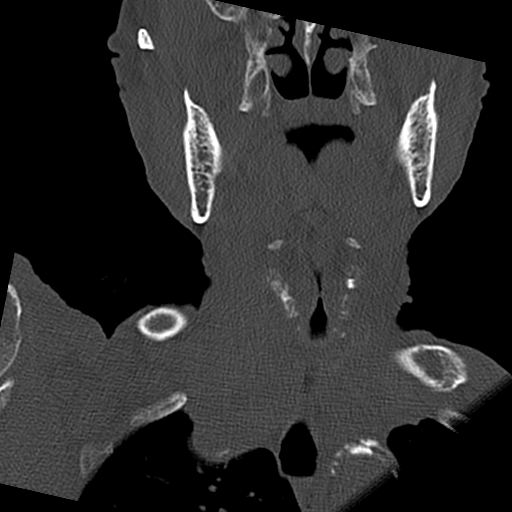
[im 19/56  bone]
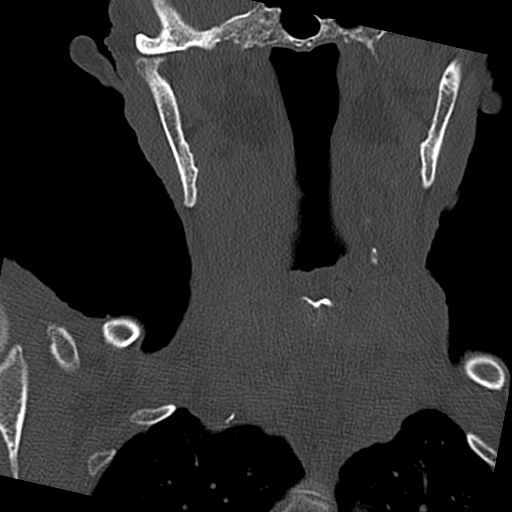
[im 28/56  bone]
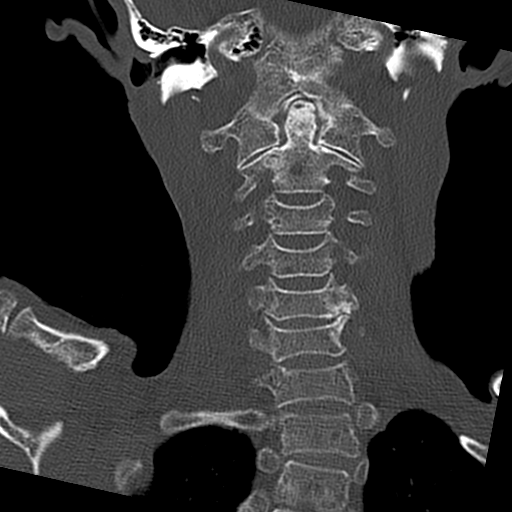
[im 37/56  bone]
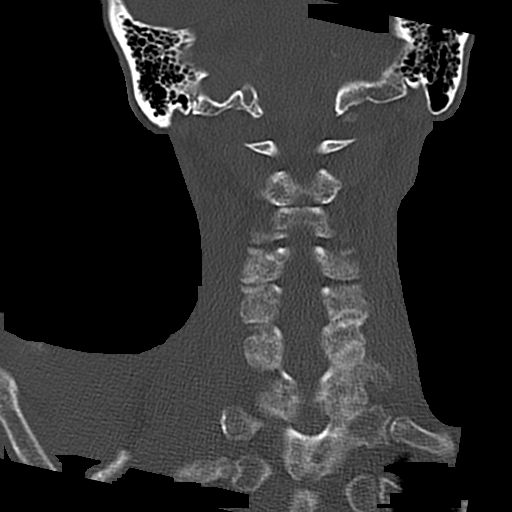
[im 46/56  bone]
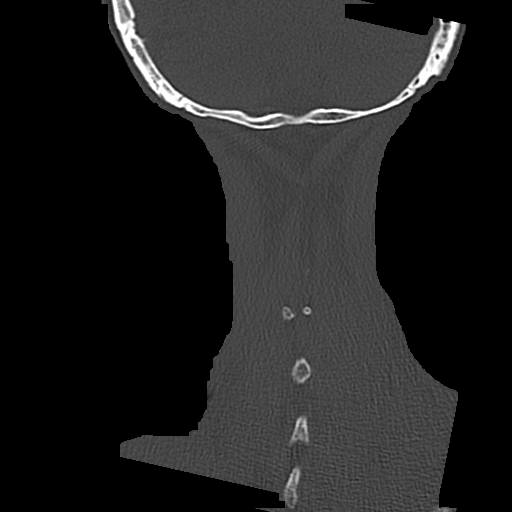

[14 of 34 positions shown; findings below may reference images not displayed]

FINDINGS: CT HEAD FINDINGS

Brain: There is no evidence of acute intracranial hemorrhage, mass
lesion, brain edema or extra-axial fluid collection. There is
progressive prominence of the ventricles and subarachnoid spaces
consistent with atrophy. There are mild chronic small vessel
ischemic changes in the periventricular white matter. There is no CT
evidence of acute cortical infarction.

Vascular: Intracranial vascular calcifications are present. No
evidence of hyperdense vessel.

Skull: Negative for fracture or focal lesion.

Sinuses/Orbits: There is some mucosal thickening in the right
maxillary sinus. The visualized paranasal sinuses, mastoid air cells
and middle ears are otherwise clear. No air-fluid levels are
identified. There is some periorbital soft tissue swelling on the
right. The globes are intact. Significant maxillary and mandibular
periodontal disease is partially imaged on the reformatted images.

Other: None.

CT CERVICAL SPINE FINDINGS

Alignment: Reversal of lordosis without significant focal angulation
or listhesis. Thoracic kyphosis.

Skull base and vertebrae: The bones are demineralized. No evidence
of acute cervical spine fracture or traumatic subluxation.
Degenerative changes are present anteriorly at C1-2.

Soft tissues and spinal canal: No prevertebral soft tissue swelling
identified. No visible canal hematoma. The soft tissues of the neck
appear diffusely edematous. The thyroid gland is heterogeneously
enlarged bilaterally, likely due to goiter.

Disc levels: The disc spaces are relatively preserved. No large disc
herniation identified.

Upper chest: The lung apices are clear. There is atherosclerosis and
tortuosity of the aorta and great vessels. As above, the thyroid
gland is heterogeneously enlarged.

Other: Exam mildly limited by thoracic kyphosis.
IMPRESSION: 1. No acute intracranial or calvarial findings identified.
2. No evidence of acute cervical spine fracture, traumatic
subluxation or static signs of instability.
3. Periodontal disease.
4. Right periorbital soft tissue swelling.
5. Thyromegaly, likely goiter.

## 2017-11-21 IMAGING — DX DG CHEST 2V
1 series · 1 of 1 positions shown · non-contrast
Comparison: 01/22/2016

CLINICAL DATA: Failure to thrive.  Hypothermia

EXAM:
CHEST  2 VIEW

[chest ap]
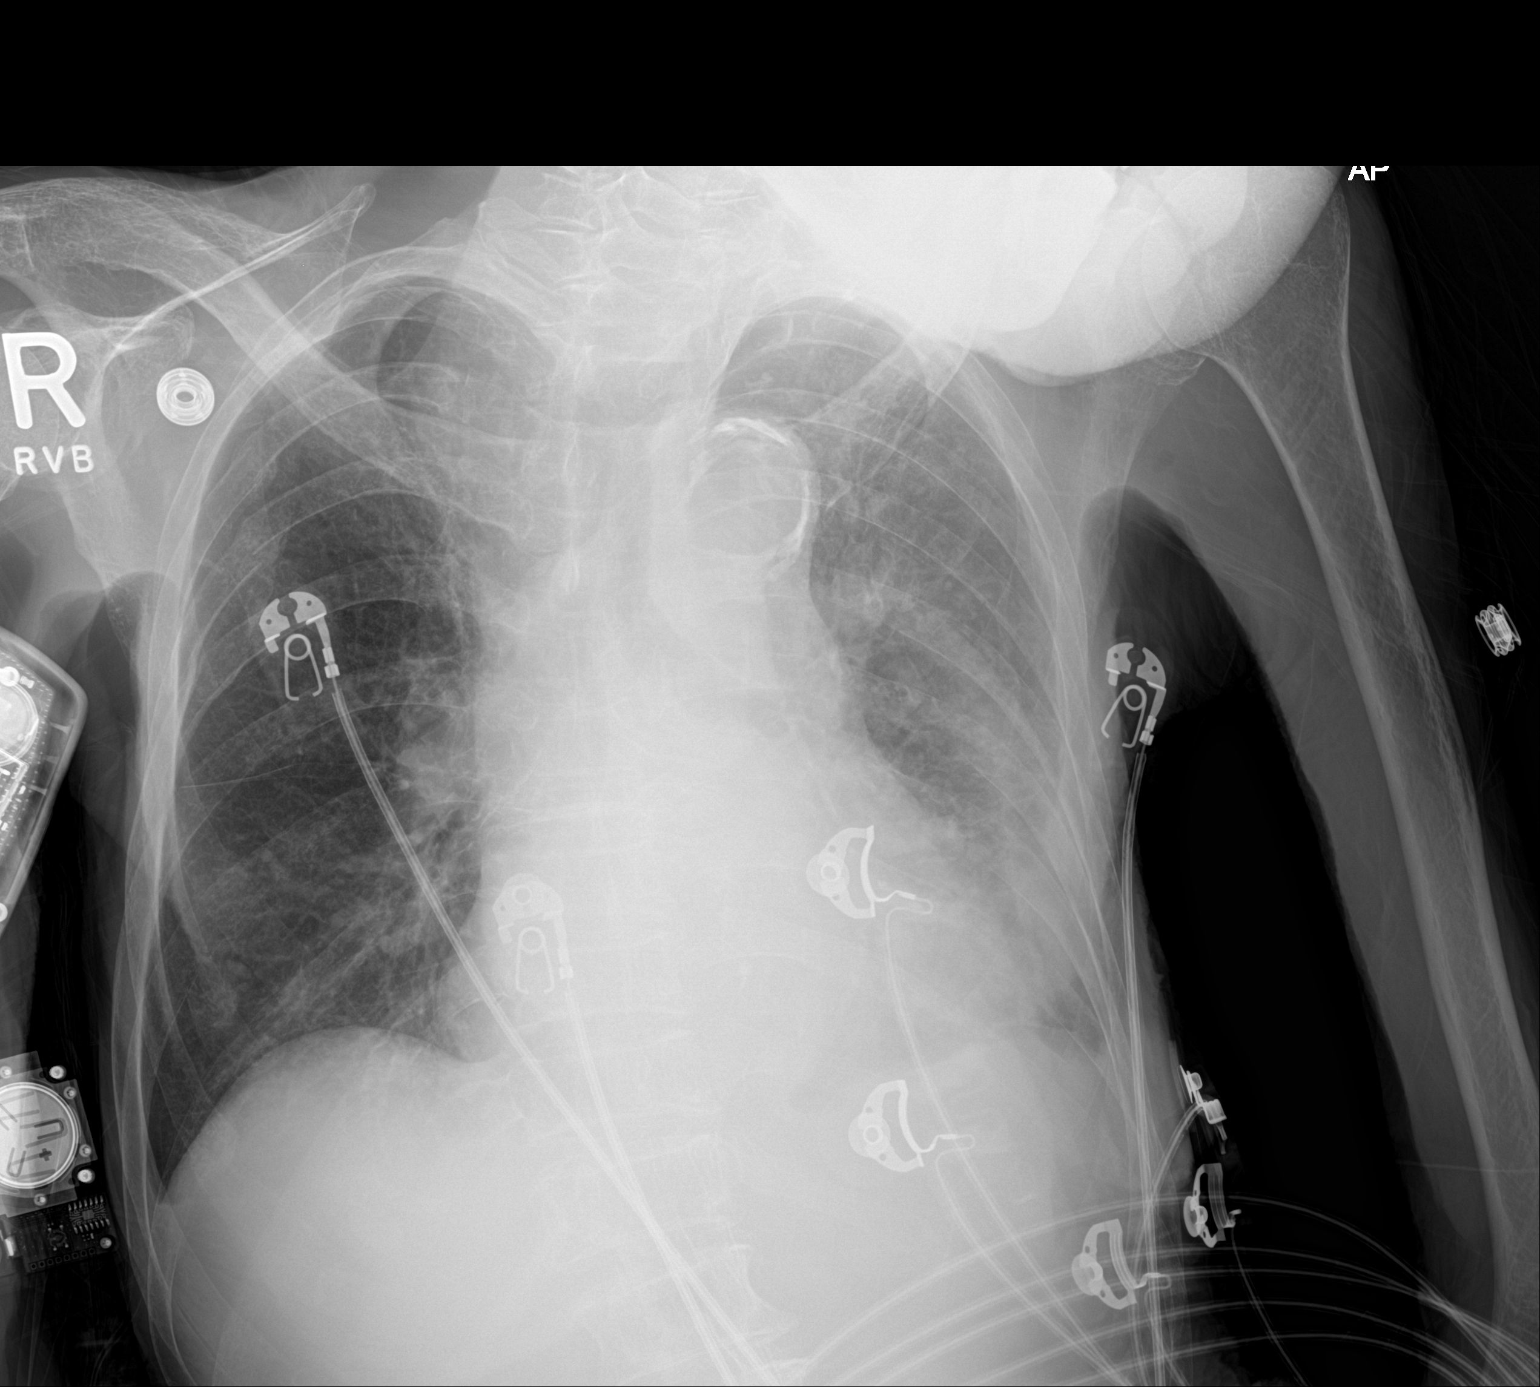

[1 of 1 positions shown; findings below may reference images not displayed]

FINDINGS: Asymmetric airspace disease on the left, presumably pneumonia. No
definitive volume loss when accounting for rotation. No effusion or
edema.

Chronic cardiomegaly. Remote posterior right rib fractures based on
prior. No acute osseous finding.
IMPRESSION: Asymmetric opacity at the left base favoring pneumonia.
